# Patient Record
Sex: Female | Born: 1980
Health system: Southern US, Community
[De-identification: ages and names within clinical notes are randomized; demographics above are authoritative.]

## PROBLEM LIST (undated history)

## (undated) DIAGNOSIS — R7309 Other abnormal glucose: Principal | ICD-10-CM

## (undated) DIAGNOSIS — T7840XA Allergy, unspecified, initial encounter: Secondary | ICD-10-CM

## (undated) DIAGNOSIS — N946 Dysmenorrhea, unspecified: Secondary | ICD-10-CM

## (undated) DIAGNOSIS — R87629 Unspecified abnormal cytological findings in specimens from vagina: Secondary | ICD-10-CM

## (undated) DIAGNOSIS — E559 Vitamin D deficiency, unspecified: Secondary | ICD-10-CM

## (undated) DIAGNOSIS — N760 Acute vaginitis: Secondary | ICD-10-CM

## (undated) DIAGNOSIS — R87810 Cervical high risk human papillomavirus (HPV) DNA test positive: Principal | ICD-10-CM

## (undated) DIAGNOSIS — B009 Herpesviral infection, unspecified: Secondary | ICD-10-CM

## (undated) DIAGNOSIS — F410 Panic disorder [episodic paroxysmal anxiety] without agoraphobia: Secondary | ICD-10-CM

## (undated) DIAGNOSIS — N926 Irregular menstruation, unspecified: Principal | ICD-10-CM

## (undated) DIAGNOSIS — N898 Other specified noninflammatory disorders of vagina: Secondary | ICD-10-CM

## (undated) DIAGNOSIS — B9689 Other specified bacterial agents as the cause of diseases classified elsewhere: Secondary | ICD-10-CM

## (undated) HISTORY — DX: Irregular menstruation, unspecified: N92.6

## (undated) HISTORY — DX: Cervical high risk human papillomavirus (HPV) DNA test positive: R87.810

## (undated) HISTORY — DX: Herpesviral infection, unspecified: B00.9

## (undated) HISTORY — DX: Other specified noninflammatory disorders of vagina: N89.8

## (undated) HISTORY — DX: Other abnormal glucose: R73.09

## (undated) HISTORY — PX: CYST REMOVAL HAND: SHX6279

## (undated) HISTORY — PX: CHOLECYSTECTOMY: SHX55

## (undated) HISTORY — PX: TUBAL LIGATION: SHX77

## (undated) HISTORY — DX: Allergy, unspecified, initial encounter: T78.40XA

## (undated) HISTORY — DX: Dysmenorrhea, unspecified: N94.6

## (undated) HISTORY — DX: Other specified bacterial agents as the cause of diseases classified elsewhere: B96.89

## (undated) HISTORY — DX: Unspecified abnormal cytological findings in specimens from vagina: R87.629

## (undated) HISTORY — DX: Acute vaginitis: N76.0

## (undated) HISTORY — PX: CERVIX LESION DESTRUCTION: SHX591

## (undated) HISTORY — DX: Vitamin D deficiency, unspecified: E55.9

---

## 2003-07-14 ENCOUNTER — Emergency Department (HOSPITAL_COMMUNITY): Admission: EM | Admit: 2003-07-14 | Discharge: 2003-07-14 | Payer: Self-pay | Admitting: Emergency Medicine

## 2011-05-30 ENCOUNTER — Encounter: Payer: Self-pay | Admitting: *Deleted

## 2011-05-30 DIAGNOSIS — IMO0002 Reserved for concepts with insufficient information to code with codable children: Secondary | ICD-10-CM | POA: Insufficient documentation

## 2011-05-30 DIAGNOSIS — B9689 Other specified bacterial agents as the cause of diseases classified elsewhere: Secondary | ICD-10-CM | POA: Insufficient documentation

## 2011-05-30 DIAGNOSIS — A499 Bacterial infection, unspecified: Secondary | ICD-10-CM | POA: Insufficient documentation

## 2011-05-30 DIAGNOSIS — N760 Acute vaginitis: Secondary | ICD-10-CM | POA: Insufficient documentation

## 2011-05-30 DIAGNOSIS — T192XXA Foreign body in vulva and vagina, initial encounter: Secondary | ICD-10-CM | POA: Insufficient documentation

## 2011-05-30 NOTE — ED Notes (Signed)
Pt reports that a condom was lost during sexual activity.

## 2011-05-31 ENCOUNTER — Encounter (HOSPITAL_COMMUNITY): Payer: Self-pay | Admitting: Emergency Medicine

## 2011-05-31 ENCOUNTER — Emergency Department (HOSPITAL_COMMUNITY)
Admission: EM | Admit: 2011-05-31 | Discharge: 2011-05-31 | Discharge status: Against Medical Advice | Payer: Self-pay | Attending: Emergency Medicine | Admitting: Emergency Medicine

## 2011-05-31 DIAGNOSIS — T192XXA Foreign body in vulva and vagina, initial encounter: Secondary | ICD-10-CM

## 2011-05-31 DIAGNOSIS — B9689 Other specified bacterial agents as the cause of diseases classified elsewhere: Secondary | ICD-10-CM

## 2011-05-31 LAB — WET PREP, GENITAL: Yeast Wet Prep HPF POC: NONE SEEN

## 2011-05-31 NOTE — ED Provider Notes (Signed)
History     CSN: [id number] Arrival date & time: 05/31/2011 12:47 AM   First MD Initiated Contact with Patient 05/31/11 0122      Chief Complaint  Patient presents with  . Foreign Body in Vagina    (Consider location/radiation/quality/duration/timing/severity/associated sxs/prior treatment) HPI  Patient relates about 7 PM she was having sex with her boyfriend and and afterwards he was looking for something. Chest and was looking for and he said he lost a tickler. She states she's tried to get it out and has not been able to find it. She states she feels like she has the urge to urinate. She states they all tissues condoms. She denies any pain.  PCP Dr. Loney Hering in [place]  History reviewed. No pertinent past medical history.  Past Surgical History  Procedure Date  . Tubal ligation   . Cholecystectomy     History reviewed. No pertinent family history.  History  Substance Use Topics  . Smoking status: Never Smoker   . Smokeless tobacco: Not on file  . Alcohol Use: No   employed  OB History    Grav Para Term Preterm Abortions TAB SAB Ect Mult Living                  Review of Systems  All other systems reviewed and are negative.    Allergies  Compazine  Home Medications  No current outpatient prescriptions on file.  BP 122/63  Pulse 87  Temp(Src) 97.8 F (36.6 C) (Oral)  Resp 20  Ht 5\' 5"  (1.651 m)  Wt 182 lb (82.555 kg)  BMI 30.29 kg/m2  SpO2 100%  LMP 05/23/2011  Vital signs normal  Physical Exam  Nursing note and vitals reviewed. Constitutional: She is oriented to person, place, and time. She appears well-developed and well-nourished.  Non-toxic appearance. She does not appear ill. No distress.  HENT:  Head: Normocephalic and atraumatic.  Right Ear: External ear normal.  Left Ear: External ear normal.  Nose: Nose normal. No mucosal edema or rhinorrhea.  Mouth/Throat: Oropharynx is clear and moist and mucous membranes are normal. No dental  abscesses or uvula swelling.  Eyes: Conjunctivae and EOM are normal. Pupils are equal, round, and reactive to light.  Neck: Normal range of motion and full passive range of motion without pain. Neck supple.  Pulmonary/Chest: Effort normal and breath sounds normal. No respiratory distress. She has no rhonchi. She exhibits no crepitus.  Abdominal: Normal appearance.  Genitourinary:       patient has normal external genitalia, a green rubber [place] tickler was found in her vaginal vault and removed intact. Patient asked if she wants to be checked for STDs and she said yes so samples were obtained.  Musculoskeletal: Normal range of motion. She exhibits no edema and no tenderness.       Moves all extremities well.   Neurological: She is alert and oriented to person, place, and time. She has normal strength. No cranial nerve deficit.  Skin: Skin is warm, dry and intact. No rash noted. No erythema. No pallor.  Psychiatric: She has a normal mood and affect. Her speech is normal and behavior is normal. Her mood appears not anxious.    ED Course  Procedures (including critical care time)  Vaginal foreign body was removed intact without difficulty with ring forceps   Labs Reviewed  WET PREP, GENITAL - Abnormal; Notable for the following:    Clue Cells, Wet Prep FEW (*)    WBC, Wet Prep  HPF POC FEW (*)    All other components within normal limits  GC/CHLAMYDIA PROBE AMP, GENITAL     1. Vaginal foreign body   2. Bacterial vaginosis    Plan patient left without getting results of her prep or discharge instructions. She told nursing staff she was going out to the waiting room to talk to her aunt and never returned  Devoria Albe, MD, FACEP    MDM          Ward Givens, MD 05/31/11 [phone]

## 2011-05-31 NOTE — ED Notes (Signed)
At approximately 0200, patient hurriedly walked out of ED stating "I will be right back, I have to go check on my aunt." Patient never returned. Dr Lynelle Doctor aware.

## 2011-06-01 LAB — GC/CHLAMYDIA PROBE AMP, GENITAL: Chlamydia, DNA Probe: NEGATIVE

## 2011-08-15 ENCOUNTER — Other Ambulatory Visit (HOSPITAL_COMMUNITY)
Admission: RE | Admit: 2011-08-15 | Discharge: 2011-08-15 | Disposition: A | Discharge status: Home / Self Care | Payer: Self-pay | Source: Ambulatory Visit | Attending: Obstetrics and Gynecology | Admitting: Obstetrics and Gynecology

## 2011-08-15 DIAGNOSIS — Z113 Encounter for screening for infections with a predominantly sexual mode of transmission: Secondary | ICD-10-CM | POA: Insufficient documentation

## 2011-08-15 DIAGNOSIS — Z01419 Encounter for gynecological examination (general) (routine) without abnormal findings: Secondary | ICD-10-CM | POA: Insufficient documentation

## 2011-08-15 DIAGNOSIS — Z1159 Encounter for screening for other viral diseases: Secondary | ICD-10-CM | POA: Insufficient documentation

## 2012-12-30 ENCOUNTER — Emergency Department (HOSPITAL_COMMUNITY): Payer: BC Managed Care – PPO

## 2012-12-30 ENCOUNTER — Emergency Department (HOSPITAL_COMMUNITY)
Admission: EM | Admit: 2012-12-30 | Discharge: 2012-12-30 | Disposition: A | Discharge status: Home / Self Care | Payer: BC Managed Care – PPO | Attending: Emergency Medicine | Admitting: Emergency Medicine

## 2012-12-30 ENCOUNTER — Encounter (HOSPITAL_COMMUNITY): Payer: Self-pay | Admitting: Emergency Medicine

## 2012-12-30 DIAGNOSIS — F411 Generalized anxiety disorder: Secondary | ICD-10-CM | POA: Insufficient documentation

## 2012-12-30 DIAGNOSIS — R42 Dizziness and giddiness: Secondary | ICD-10-CM | POA: Insufficient documentation

## 2012-12-30 DIAGNOSIS — F419 Anxiety disorder, unspecified: Secondary | ICD-10-CM

## 2012-12-30 DIAGNOSIS — Z8659 Personal history of other mental and behavioral disorders: Secondary | ICD-10-CM | POA: Insufficient documentation

## 2012-12-30 DIAGNOSIS — R0789 Other chest pain: Secondary | ICD-10-CM | POA: Insufficient documentation

## 2012-12-30 DIAGNOSIS — R450 Nervousness: Secondary | ICD-10-CM | POA: Insufficient documentation

## 2012-12-30 DIAGNOSIS — R0602 Shortness of breath: Secondary | ICD-10-CM | POA: Insufficient documentation

## 2012-12-30 HISTORY — DX: Panic disorder (episodic paroxysmal anxiety): F41.0

## 2012-12-30 MED ORDER — ALPRAZOLAM 0.5 MG PO TABS: ORAL_TABLET | ORAL | Status: DC

## 2012-12-30 NOTE — ED Provider Notes (Signed)
History    This chart was scribed for Carol Lennert, MD by Carol Wong, ED Scribe. This patient was seen in room APA12/APA12 and the patient's care was started 2:29 PM.  CSN: [id number] Arrival date & time 12/30/12  1258  First MD Initiated Contact with Patient 12/30/12 1426     Chief Complaint  Patient presents with  . Dizziness  . Anxiety    Patient is a 32 y.o. female presenting with anxiety. The history is provided by the patient. No language interpreter was used.  Anxiety This is a recurrent problem. The current episode started yesterday. The problem occurs rarely. The problem has not changed since onset.Pertinent negatives include no chest pain, no abdominal pain and no headaches. Nothing aggravates the symptoms. Nothing relieves the symptoms.    HPI Comments: Carol Wong is a 32 y.o. female who presents to the Emergency Department complaining of anxiety and dizziness that started last night. Pt believes she had an anxiety attack last night with symptoms that included chest pressure and SOB. Per pt, she "felt like I was going to die." States the symptoms come and go and she had associated dizziness as well. She does not take any medications for this and does not have a PCP.   Past Medical History  Diagnosis Date  . Panic attacks    Past Surgical History  Procedure Laterality Date  . Tubal ligation    . Cholecystectomy     History reviewed. No pertinent family history. History  Substance Use Topics  . Smoking status: Never Smoker   . Smokeless tobacco: Not on file  . Alcohol Use: No   OB History   Grav Para Term Preterm Abortions TAB SAB Ect Mult Living                 Review of Systems  Constitutional: Negative for appetite change and fatigue.  HENT: Negative for congestion, sinus pressure and ear discharge.   Eyes: Negative for discharge.  Respiratory: Negative for cough.   Cardiovascular: Negative for chest pain.  Gastrointestinal: Negative for  abdominal pain and diarrhea.  Genitourinary: Negative for frequency and hematuria.  Musculoskeletal: Negative for back pain.  Skin: Negative for rash.  Neurological: Positive for dizziness. Negative for seizures and headaches.  Psychiatric/Behavioral: Negative for hallucinations. The patient is nervous/anxious.     Allergies  Compazine  Home Medications   Current Outpatient Rx  Name  Route  Sig  Dispense  Refill  . ibuprofen (ADVIL,MOTRIN) 200 MG tablet   Oral   Take 200 mg by mouth every 8 (eight) hours as needed for pain or headache.          BP 121/71  Pulse 96  Temp(Src) 98.3 F (36.8 C) (Oral)  Resp 16  SpO2 100%  LMP 12/10/2012 Physical Exam  Nursing note and vitals reviewed. Constitutional: She is oriented to person, place, and time. She appears well-developed.  HENT:  Head: Normocephalic.  Eyes: Conjunctivae and EOM are normal. No scleral icterus.  Neck: Neck supple. No thyromegaly present.  Cardiovascular: Normal rate and regular rhythm.  Exam reveals no gallop and no friction rub.   No murmur heard. Pulmonary/Chest: No stridor. She has no wheezes. She has no rales. She exhibits no tenderness.  Abdominal: She exhibits no distension. There is no tenderness. There is no rebound.  Musculoskeletal: Normal range of motion. She exhibits no edema.  Lymphadenopathy:    She has no cervical adenopathy.  Neurological: She is oriented to person,  place, and time. Coordination normal.  Skin: No rash noted. No erythema.  Psychiatric: She has a normal mood and affect. Her behavior is normal.    ED Course  Procedures (including critical care time)  DIAGNOSTIC STUDIES: Oxygen Saturation is 100% on RA, normal by my interpretation.    COORDINATION OF CARE: 2:33 PM Discussed treatment plan with pt at bedside and pt agreed to plan.   Labs Reviewed - No data to display No results found. No diagnosis found.  Date: 12/30/2012  Rate:74  Rhythm: normal sinus rhythm  QRS  Axis: normal  Intervals: normal  ST/T Wave abnormalities: normal  Conduction Disutrbances:none  Narrative Interpretation:   Old EKG Reviewed: none available   MDM    The chart was scribed for me under my direct supervision.  I personally performed the history, physical, and medical decision making and all procedures in the evaluation of this patient.Carol Lennert, MD 12/30/12 1524

## 2012-12-30 NOTE — ED Notes (Signed)
Denies si/hi/hallucinations/hearing voices.

## 2012-12-30 NOTE — ED Notes (Signed)
Pt presents with c/o " not feeling well x 2-3 days, worsening anxiety and dizzness".  Pt denies headache, states "temple areas are achy but denies pain. Denies blurred vision. Pt reports just " not feeling right". Pt is alert and oriented x 4, skin pink warm and dry to touch. NAD noted at this time.

## 2012-12-30 NOTE — ED Notes (Signed)
Dizziness intermittent. Chest pressure. "I feel like Im going to die". Pt states she can breathe but something is keeping her from breathing like shen normally should. Denies n/v/d. Hx of panic attacks. Nondiaphoretic. nad at thist ime.

## 2013-05-27 ENCOUNTER — Ambulatory Visit: Payer: Self-pay | Admitting: Family Medicine

## 2013-06-11 ENCOUNTER — Encounter: Payer: Self-pay | Admitting: Family Medicine

## 2013-06-11 ENCOUNTER — Ambulatory Visit: Payer: Self-pay | Admitting: Family Medicine

## 2013-06-11 ENCOUNTER — Other Ambulatory Visit: Payer: Self-pay | Admitting: Adult Health

## 2013-06-24 ENCOUNTER — Encounter: Payer: Self-pay | Admitting: Adult Health

## 2013-06-24 ENCOUNTER — Ambulatory Visit (INDEPENDENT_AMBULATORY_CARE_PROVIDER_SITE_OTHER): Payer: BC Managed Care – PPO | Admitting: Adult Health

## 2013-06-24 VITALS — BP 118/70 | HR 78 | Ht 65.0 in | Wt 175.0 lb

## 2013-06-24 DIAGNOSIS — B9689 Other specified bacterial agents as the cause of diseases classified elsewhere: Secondary | ICD-10-CM

## 2013-06-24 DIAGNOSIS — A499 Bacterial infection, unspecified: Secondary | ICD-10-CM

## 2013-06-24 DIAGNOSIS — N898 Other specified noninflammatory disorders of vagina: Secondary | ICD-10-CM

## 2013-06-24 DIAGNOSIS — Z01419 Encounter for gynecological examination (general) (routine) without abnormal findings: Secondary | ICD-10-CM

## 2013-06-24 DIAGNOSIS — F419 Anxiety disorder, unspecified: Secondary | ICD-10-CM | POA: Insufficient documentation

## 2013-06-24 DIAGNOSIS — N760 Acute vaginitis: Secondary | ICD-10-CM

## 2013-06-24 HISTORY — DX: Acute vaginitis: B96.89

## 2013-06-24 HISTORY — DX: Other specified noninflammatory disorders of vagina: N89.8

## 2013-06-24 LAB — CBC
HEMATOCRIT: 38 % (ref 36.0–46.0)
HEMOGLOBIN: 12.1 g/dL (ref 12.0–15.0)
MCH: 25.2 pg — ABNORMAL LOW (ref 26.0–34.0)
MCHC: 31.8 g/dL (ref 30.0–36.0)
MCV: 79 fL (ref 78.0–100.0)
Platelets: 230 10*3/uL (ref 150–400)
RBC: 4.81 MIL/uL (ref 3.87–5.11)
RDW: 14.7 % (ref 11.5–15.5)
WBC: 9.1 10*3/uL (ref 4.0–10.5)

## 2013-06-24 LAB — POCT WET PREP (WET MOUNT)
TRICHOMONAS WET PREP HPF POC: NEGATIVE
WBC WET PREP: POSITIVE

## 2013-06-24 MED ORDER — METRONIDAZOLE 500 MG PO TABS: 500.0000 mg | ORAL_TABLET | Freq: Two times a day (BID) | ORAL | Status: DC

## 2013-06-24 NOTE — Patient Instructions (Signed)
Generalized Anxiety Disorder Generalized anxiety disorder (GAD) is a mental disorder. It interferes with life functions, including relationships, work, and school. GAD is different from normal anxiety, which everyone experiences at some point in their lives in response to specific life events and activities. Normal anxiety actually helps [place] prepare for and get through these life events and activities. Normal anxiety goes away after the event or activity is over.  GAD causes anxiety that is not necessarily related to specific events or activities. It also causes excess anxiety in proportion to specific events or activities. The anxiety associated with GAD is also difficult to control. GAD can vary from mild to severe. People with severe GAD can have intense waves of anxiety with physical symptoms (panic attacks).  SYMPTOMS The anxiety and worry associated with GAD are difficult to control. This anxiety and worry are related to many life events and activities and also occur more days than not for 6 months or longer. People with GAD also have three or more of the following symptoms (one or more in children):  Restlessness.   Fatigue.  Difficulty concentrating.   Irritability.  Muscle tension.  Difficulty sleeping or unsatisfying sleep. DIAGNOSIS GAD is diagnosed through an assessment by your caregiver. Your caregiver will ask you questions aboutyour mood,physical symptoms, and events in your life. Your caregiver may ask you about your medical history and use of alcohol or drugs, including prescription medications. Your caregiver may also do a physical exam and blood tests. Certain medical conditions and the use of certain substances can cause symptoms similar to those associated with GAD. Your caregiver may refer you to a mental health specialist for further evaluation. TREATMENT The following therapies are usually used to treat GAD:   Medication Antidepressant medication usually is  prescribed for long-term daily control. Antianxiety medications may be added in severe cases, especially when panic attacks occur.   Talk therapy (psychotherapy) Certain types of talk therapy can be helpful in treating GAD by providing support, education, and guidance. A form of talk therapy called cognitive behavioral therapy can teach you healthy ways to think about and react to daily life events and activities.  Stress managementtechniques These include yoga, meditation, and exercise and can be very helpful when they are practiced regularly. A mental health specialist can help determine which treatment is best for you. Some people see improvement with one therapy. However, other people require a combination of therapies. Document Released: 09/24/2012 Document Reviewed: 09/24/2012 [facility] Patient Information 2014 [place], [place]. Bacterial Vaginosis Bacterial vaginosis is a vaginal infection that occurs when the normal balance of bacteria in the vagina is disrupted. It results from an overgrowth of certain bacteria. This is the most common vaginal infection in women of childbearing age. Treatment is important to prevent complications, especially in pregnant women, as it can cause a premature delivery. CAUSES  Bacterial vaginosis is caused by an increase in harmful bacteria that are normally present in smaller amounts in the vagina. Several different kinds of bacteria can cause bacterial vaginosis. However, the reason that the condition develops is not fully understood. RISK FACTORS Certain activities or behaviors can put you at an increased risk of developing bacterial vaginosis, including:  Having a new sex partner or multiple sex partners.  Douching.  Using an intrauterine device (IUD) for contraception. Women do not get bacterial vaginosis from toilet seats, bedding, swimming pools, or contact with objects around them. SIGNS AND SYMPTOMS  Some women with bacterial vaginosis have no  signs or symptoms. Common  symptoms include:  Grey vaginal discharge.  A fishlike odor with discharge, especially after sexual intercourse.  Itching or burning of the vagina and vulva.  Burning or pain with urination. DIAGNOSIS  Your health care provider will take a medical history and examine the vagina for signs of bacterial vaginosis. A sample of vaginal fluid may be taken. Your health care provider will look at this sample under a microscope to check for bacteria and abnormal cells. A vaginal pH test may also be done.  TREATMENT  Bacterial vaginosis may be treated with antibiotic medicines. These may be given in the form of a pill or a vaginal cream. A second round of antibiotics may be prescribed if the condition comes back after treatment.  HOME CARE INSTRUCTIONS   Only take over-the-counter or prescription medicines as directed by your health care provider.  If antibiotic medicine was prescribed, take it as directed. Make sure you finish it even if you start to feel better.  Do not have sex until treatment is completed.  Tell all sexual partners that you have a vaginal infection. They should see their health care provider and be treated if they have problems, such as a mild rash or itching.  Practice safe sex by using condoms and only having one sex partner. SEEK MEDICAL CARE IF:   Your symptoms are not improving after 3 days of treatment.  You have increased discharge or pain.  You have a fever. MAKE SURE YOU:   Understand these instructions.  Will watch your condition.  Will get help right away if you are not doing well or get worse. FOR MORE INFORMATION  Centers for Disease Control and Prevention, Division of STD Prevention: SolutionApps.co.zawww.cdc.gov/std American Sexual Health Association (ASHA): www.ashastd.org  Document Released: 05/30/2005 Document Revised: 03/20/2013 Document Reviewed: 01/09/2013 [facility] Patient Information 2014 [place]. Physical in 1 year Follow  labs Mammogram at 7640 See counselor  Today

## 2013-06-24 NOTE — Progress Notes (Signed)
Patient ID: Carol Wong, female   DOB: 11/21/1980, 33 y.o.   MRN: [id number] History of Present Illness: Carol Wong is a 33 year old black female, single in for a physical.She had a normal pap with negative HPV 08/15/11.She complains of being anxious, "not feeling like herself, like she is outside her body"  And that she is dying at times, has been rx'd xanax but did not take, does not want side effects or dependence. [facility] [place] talk.She complains of BTB and vaginal discharge with odor.Works at [employer] and lives with 2 kids.Seen at urgent care had elevated blood sugar and BP.Pt thinks she has lymph node right neck.Has elevated heart reate at times.  Current Medications, Allergies, Past Medical History, Past Surgical History, Family History and Social History were reviewed [facility].   Past Medical History  Diagnosis Date  . Panic attacks   . Vaginal discharge 06/24/2013  . BV (bacterial vaginosis) 06/24/2013   Past Surgical History  Procedure Laterality Date  . Tubal ligation    . Cholecystectomy    . Cyst removal hand Left   Current outpatient prescriptions:ibuprofen (ADVIL,MOTRIN) 200 MG tablet, Take 200 mg by mouth every 8 (eight) hours as needed for pain or headache., Disp: , Rfl: ;  metroNIDAZOLE (FLAGYL) 500 MG tablet, Take 1 tablet (500 mg total) by mouth 2 (two) times daily., Disp: 14 tablet, Rfl: 0  Review of Systems: Patient denies any headaches, blurred vision, shortness of breath, chest pain, abdominal pain, problems with bowel movements, urination, or intercourse. No joint pain, see HPI.    Physical Exam:BP 118/70  Pulse 78  Ht 5\' 5"  (1.651 m)  Wt 175 lb (79.379 kg)  BMI 29.12 kg/m2  LMP 06/16/2013 General:  Well developed, well nourished, no acute distress Skin:  Warm and dry Neck:  Midline trachea, normal thyroid, no lymph node felt ?ligament, non tender Lungs; Clear to auscultation bilaterally Breast:  No  dominant palpable mass, retraction, or nipple discharge Cardiovascular: Regular rate and rhythm Abdomen:  Soft, non tender, no hepatosplenomegaly Pelvic:  External genitalia is normal in appearance.  The vagina is normal in appearance, except for tannish discharge. The cervix is bulbous.  Uterus is felt to be normal size, shape, and contour.  No                adnexal masses or tenderness noted.wet prep was +WBC and clue cells, GC/CGHL sent. Extremities:  No swelling or varicosities noted Psych:  No mood changes, alert and cooperative but anxious Told her how to check pulse and that she sounds like she many have GAD.  Impression: Yearly gyn exam no pap Anxiety Vaginal discharge BV    Plan: Rx flagyl 500 mg 1 bid x 7 days, no alcohol, review handout on BV   GC/CHL sent Check CBC,CMP and TSH [facility] counseling good,since does not want meds Review hand out on GAD(general anxiety disorder) and that the counselor may want her to try meds Physical in 1 year

## 2013-06-25 ENCOUNTER — Telehealth: Payer: Self-pay | Admitting: Adult Health

## 2013-06-25 LAB — COMPREHENSIVE METABOLIC PANEL
ALBUMIN: 4.1 g/dL (ref 3.5–5.2)
ALT: 15 U/L (ref 0–35)
AST: 21 U/L (ref 0–37)
Alkaline Phosphatase: 56 U/L (ref 39–117)
BUN: 10 mg/dL (ref 6–23)
CALCIUM: 9 mg/dL (ref 8.4–10.5)
CHLORIDE: 104 meq/L (ref 96–112)
CO2: 24 meq/L (ref 19–32)
Creat: 0.82 mg/dL (ref 0.50–1.10)
Glucose, Bld: 96 mg/dL (ref 70–99)
POTASSIUM: 3.7 meq/L (ref 3.5–5.3)
Sodium: 137 mEq/L (ref 135–145)
Total Bilirubin: 0.9 mg/dL (ref 0.3–1.2)
Total Protein: 7 g/dL (ref 6.0–8.3)

## 2013-06-25 LAB — GC/CHLAMYDIA PROBE AMP
CT PROBE, AMP APTIMA: NEGATIVE
GC Probe RNA: NEGATIVE

## 2013-06-25 LAB — TSH: TSH: 1.576 u[IU]/mL (ref 0.350–4.500)

## 2013-06-25 NOTE — Telephone Encounter (Signed)
Pt aware of labs  

## 2013-12-25 ENCOUNTER — Encounter: Payer: Self-pay | Admitting: *Deleted

## 2014-01-20 ENCOUNTER — Emergency Department (HOSPITAL_COMMUNITY)
Admission: EM | Admit: 2014-01-20 | Discharge: 2014-01-20 | Disposition: A | Discharge status: Home / Self Care | Payer: 59 | Attending: Emergency Medicine | Admitting: Emergency Medicine

## 2014-01-20 ENCOUNTER — Encounter (HOSPITAL_COMMUNITY): Payer: Self-pay | Admitting: Emergency Medicine

## 2014-01-20 ENCOUNTER — Emergency Department (HOSPITAL_COMMUNITY): Payer: 59

## 2014-01-20 DIAGNOSIS — Z8619 Personal history of other infectious and parasitic diseases: Secondary | ICD-10-CM | POA: Diagnosis not present

## 2014-01-20 DIAGNOSIS — F410 Panic disorder [episodic paroxysmal anxiety] without agoraphobia: Secondary | ICD-10-CM | POA: Diagnosis not present

## 2014-01-20 DIAGNOSIS — R079 Chest pain, unspecified: Secondary | ICD-10-CM | POA: Insufficient documentation

## 2014-01-20 DIAGNOSIS — Z8742 Personal history of other diseases of the female genital tract: Secondary | ICD-10-CM | POA: Diagnosis not present

## 2014-01-20 MED ORDER — LORAZEPAM 1 MG PO TABS: 1.0000 mg | ORAL_TABLET | Freq: Three times a day (TID) | ORAL | Status: DC | PRN

## 2014-01-20 NOTE — ED Provider Notes (Signed)
CSN: [id number]     Arrival date & time 01/20/14  1602 History   First MD Initiated Contact with Patient 01/20/14 1624     Chief Complaint  Patient presents with  . Chest Pain     (Consider location/radiation/quality/duration/timing/severity/associated sxs/prior Treatment) HPI.... lower central chest pain for 2 days, intermittent, squeezing in sensation. No frank dyspnea, diaphoresis, nausea. Nonsmoker. No chronic health problems. Patient has seasonal allergies. Grandmother passed away  08/22/2024which is making her very anxious. No history of early cardiac death.  Past Medical History  Diagnosis Date  . Panic attacks   . Vaginal discharge 06/24/2013  . BV (bacterial vaginosis) 06/24/2013  . Allergy    Past Surgical History  Procedure Laterality Date  . Tubal ligation    . Cholecystectomy    . Cyst removal hand Left    Family History  Problem Relation Age of Onset  . Hypertension Mother   . Diabetes Mother   . Hypertension Father   . Diabetes Father   . Sickle cell trait Daughter   . Anemia Daughter   . Thyroid disease Maternal Aunt   . Asthma Maternal Uncle   . Cancer Maternal Grandmother     lung   . Kidney disease Maternal Grandfather   . Diabetes Maternal Grandfather    History  Substance Use Topics  . Smoking status: Never Smoker   . Smokeless tobacco: Never Used  . Alcohol Use: No   OB History   Grav Para Term Preterm Abortions TAB SAB Ect Mult Living   3 2   1  1   2      Review of Systems  All other systems reviewed and are negative.     Allergies  Compazine  Home Medications   Prior to Admission medications   Medication Sig Start Date End Date Taking? Authorizing Provider  LORazepam (ATIVAN) 1 MG tablet Take 1 tablet (1 mg total) by mouth 3 (three) times daily as needed for anxiety. 01/20/14   Donnetta Hutching, MD   BP 121/87  Pulse 86  Temp(Src) 98 F (36.7 C) (Oral)  Resp 18  Ht 5\' 5"  (1.651 m)  Wt 170 lb (77.111 kg)  BMI 28.29 kg/m2  SpO2 100%   LMP 01/12/2014 Physical Exam  Nursing note and vitals reviewed. Constitutional: She is oriented to person, place, and time. She appears well-developed and well-nourished.  HENT:  Head: Normocephalic and atraumatic.  Eyes: Conjunctivae and EOM are normal. Pupils are equal, round, and reactive to light.  Neck: Normal range of motion. Neck supple.  Cardiovascular: Normal rate, regular rhythm and normal heart sounds.   Pulmonary/Chest: Effort normal and breath sounds normal.  Abdominal: Soft. Bowel sounds are normal.  Musculoskeletal: Normal range of motion.  Neurological: She is alert and oriented to person, place, and time.  Skin: Skin is warm and dry.  Psychiatric: She has a normal mood and affect. Her behavior is normal.    ED Course  Procedures (including critical care time) Labs Review Labs Reviewed - No data to display  Imaging Review Dg Chest 2 View  01/20/2014   CLINICAL DATA:  Chest pressure and shortness of breath today  EXAM: CHEST  2 VIEW  COMPARISON:  12/30/2012  FINDINGS: Normal heart size, mediastinal contours, and pulmonary vascularity.  Lungs clear.  No pleural effusion or pneumothorax.  Biconvex thoracic scoliosis.  IMPRESSION: No acute abnormalities.   Electronically Signed   By: Ulyses Southward M.D.   On: 01/20/2014 17:30  EKG Interpretation   Date/Time:  Monday January 20 2014 16:08:17 EDT Ventricular Rate:  84 PR Interval:  104 QRS Duration: 78 QT Interval:  348 QTC Calculation: 411 R Axis:   51 Text Interpretation:  Sinus rhythm with short PR Nonspecific T wave  abnormality Abnormal ECG Confirmed by Calbert Hulsebus  MD, Kisha Messman ([id number]) on 01/20/2014  4:34:39 PM      MDM   Final diagnoses:  Chest pain, unspecified chest pain type    Patient is low risk for acute coronary syndrome or pulmonary embolism. Chest x-ray and EKG are normal. Discharge medications Ativan 1 mg    Donnetta HutchingBrian Alean Kromer, MD 01/20/14 1749

## 2014-01-20 NOTE — ED Notes (Signed)
Pt c/o "squeezing " sensation around heart and difficulty breathing for past 2 days.

## 2014-01-20 NOTE — ED Notes (Signed)
Pt states intermittent, center pain to chest at times. Pt also states she sometimes has to make herself take a deep breath. Pt states she has been under a lot of stress lately

## 2014-01-20 NOTE — Discharge Instructions (Signed)
Chest Pain (Nonspecific) It is often hard to give a diagnosis for the cause of chest pain. There is always a chance that your pain could be related to something serious, such as a heart attack or a blood clot in the lungs. You need to follow up with your doctor. HOME CARE  If antibiotic medicine was given, take it as directed by your doctor. Finish the medicine even if you start to feel better.  For the next few days, avoid activities that bring on chest pain. Continue physical activities as told by your doctor.  Do not use any tobacco products. This includes cigarettes, chewing tobacco, and e-cigarettes.  Avoid drinking alcohol.  Only take medicine as told by your doctor.  Follow your doctor's suggestions for more testing if your chest pain does not go away.  Keep all doctor visits you made. GET HELP IF:  Your chest pain does not go away, even after treatment.  You have a rash with blisters on your chest.  You have a fever. GET HELP RIGHT AWAY IF:   You have more pain or pain that spreads to your arm, neck, jaw, back, or belly (abdomen).  You have shortness of breath.  You cough more than usual or cough up blood.  You have very bad back or belly pain.  You feel sick to your stomach (nauseous) or throw up (vomit).  You have very bad weakness.  You pass out (faint).  You have chills. This is an emergency. Do not wait to see if the problems will go away. Call your local emergency services (911 in [place] Do not drive yourself to the hospital. MAKE SURE YOU:   Understand these instructions.  Will watch your condition.  Will get help right away if you are not doing well or get worse. Document Released: 11/16/2007 Document Revised: 06/04/2013 Document Reviewed: 11/16/2007 [facility] Patient Information 2015 [place], [place]. This information is not intended to replace advice given to you by your health care provider. Make sure you discuss any questions you have with your  health care provider.   EKG and chest x-ray were normal. Medication for anxiety. Try to get a primary care physician.

## 2014-02-04 ENCOUNTER — Ambulatory Visit: Payer: 59 | Admitting: Adult Health

## 2014-02-05 ENCOUNTER — Ambulatory Visit: Payer: 59 | Admitting: Adult Health

## 2014-02-12 ENCOUNTER — Encounter: Payer: Self-pay | Admitting: Adult Health

## 2014-02-12 ENCOUNTER — Ambulatory Visit (INDEPENDENT_AMBULATORY_CARE_PROVIDER_SITE_OTHER): Payer: 59 | Admitting: Adult Health

## 2014-02-12 VITALS — BP 122/80 | Temp 98.6°F | Ht 65.0 in | Wt 173.0 lb

## 2014-02-12 DIAGNOSIS — N926 Irregular menstruation, unspecified: Secondary | ICD-10-CM | POA: Insufficient documentation

## 2014-02-12 HISTORY — DX: Irregular menstruation, unspecified: N92.6

## 2014-02-12 NOTE — Patient Instructions (Signed)
return in 1 week for pap and physical See dentist

## 2014-02-12 NOTE — Progress Notes (Signed)
Subjective:     Patient ID: Carol Wong, female   DOB: 17-Jul-1980, 33 y.o.   MRN: [id number]  HPI Carol Wong is a 33 year old black female in complaining of irregular cycles,may be late or even heavy at times,she is sp BTL.Complains of congestion and having to clear throat and tongue looks white to her.  Review of Systems See HPI Reviewed past medical,surgical, social and family history. Reviewed medications and allergies.     Objective:   Physical Exam BP 122/80  Temp(Src) 98.6 F (37 C)  Ht  (1.651 m)  Wt 173 lb (78.472 kg)  BMI 28.79 kg/m2  LMP 02/03/2014   Skin warm and dry.Pelvic: external genitalia is normal in appearance, vagina: white discharge without odor, cervix:smooth and bulbous, uterus: normal size, shape and contour, non tender, no masses felt, adnexa: no masses or tenderness noted.  GC/CHL obtained.   Neck: mid line trachea, normal thyroid. Lungs: clear to ausculation bilaterally. Cardiovascular: regular rate and rhythm.tongue looks normal, with good texture, no lesions.   Assessment:     Irregular periods    Plan:    See Dentist as ascheduled Check CBC,CMP,TSH and GC/CHL Return in 1 week for pap and physical   May try OCs after labs back, will discuss next week

## 2014-02-13 LAB — CBC
HEMATOCRIT: 37.3 % (ref 36.0–46.0)
Hemoglobin: 11.9 g/dL — ABNORMAL LOW (ref 12.0–15.0)
MCH: 25.1 pg — ABNORMAL LOW (ref 26.0–34.0)
MCHC: 31.9 g/dL (ref 30.0–36.0)
MCV: 78.7 fL (ref 78.0–100.0)
Platelets: 238 10*3/uL (ref 150–400)
RBC: 4.74 MIL/uL (ref 3.87–5.11)
RDW: 14.2 % (ref 11.5–15.5)
WBC: 8.1 10*3/uL (ref 4.0–10.5)

## 2014-02-13 LAB — COMPREHENSIVE METABOLIC PANEL
ALK PHOS: 44 U/L (ref 39–117)
ALT: 14 U/L (ref 0–35)
AST: 18 U/L (ref 0–37)
Albumin: 4.1 g/dL (ref 3.5–5.2)
BILIRUBIN TOTAL: 0.6 mg/dL (ref 0.2–1.2)
BUN: 10 mg/dL (ref 6–23)
CO2: 26 mEq/L (ref 19–32)
CREATININE: 0.82 mg/dL (ref 0.50–1.10)
Calcium: 9.2 mg/dL (ref 8.4–10.5)
Chloride: 103 mEq/L (ref 96–112)
Glucose, Bld: 90 mg/dL (ref 70–99)
Potassium: 3.8 mEq/L (ref 3.5–5.3)
Sodium: 138 mEq/L (ref 135–145)
Total Protein: 6.8 g/dL (ref 6.0–8.3)

## 2014-02-13 LAB — GC/CHLAMYDIA PROBE AMP
CT Probe RNA: NEGATIVE
GC Probe RNA: NEGATIVE

## 2014-02-13 LAB — TSH: TSH: 1.325 u[IU]/mL (ref 0.350–4.500)

## 2014-02-18 ENCOUNTER — Telehealth: Payer: Self-pay | Admitting: Adult Health

## 2014-02-18 NOTE — Telephone Encounter (Signed)
No voice mail,if calls back labs normal

## 2014-02-19 ENCOUNTER — Ambulatory Visit: Payer: 59 | Admitting: Adult Health

## 2014-02-20 ENCOUNTER — Telehealth: Payer: Self-pay | Admitting: Family Medicine

## 2014-02-24 ENCOUNTER — Emergency Department (HOSPITAL_COMMUNITY)
Admission: EM | Admit: 2014-02-24 | Discharge: 2014-02-24 | Disposition: A | Discharge status: Home / Self Care | Payer: 59 | Attending: Emergency Medicine | Admitting: Emergency Medicine

## 2014-02-24 ENCOUNTER — Encounter (HOSPITAL_COMMUNITY): Payer: Self-pay | Admitting: Emergency Medicine

## 2014-02-24 DIAGNOSIS — N926 Irregular menstruation, unspecified: Secondary | ICD-10-CM | POA: Insufficient documentation

## 2014-02-24 DIAGNOSIS — Z8619 Personal history of other infectious and parasitic diseases: Secondary | ICD-10-CM | POA: Diagnosis not present

## 2014-02-24 DIAGNOSIS — N939 Abnormal uterine and vaginal bleeding, unspecified: Secondary | ICD-10-CM | POA: Diagnosis not present

## 2014-02-24 DIAGNOSIS — R0602 Shortness of breath: Secondary | ICD-10-CM | POA: Insufficient documentation

## 2014-02-24 DIAGNOSIS — I279 Pulmonary heart disease, unspecified: Secondary | ICD-10-CM | POA: Insufficient documentation

## 2014-02-24 DIAGNOSIS — J019 Acute sinusitis, unspecified: Secondary | ICD-10-CM

## 2014-02-24 DIAGNOSIS — J069 Acute upper respiratory infection, unspecified: Secondary | ICD-10-CM | POA: Diagnosis not present

## 2014-02-24 DIAGNOSIS — F411 Generalized anxiety disorder: Secondary | ICD-10-CM | POA: Diagnosis not present

## 2014-02-24 MED ORDER — PREDNISONE 10 MG PO TABS: 20.0000 mg | ORAL_TABLET | Freq: Every day | ORAL | Status: DC

## 2014-02-24 MED ORDER — PREDNISONE 50 MG PO TABS
60.0000 mg | ORAL_TABLET | Freq: Once | ORAL | Status: AC
  Administered 2014-02-24: 60 mg via ORAL
  Filled 2014-02-24 (×2): qty 1

## 2014-02-24 MED ORDER — OXYMETAZOLINE HCL 0.05 % NA SOLN
2.0000 | Freq: Once | NASAL | Status: AC
  Administered 2014-02-24: 2 via NASAL
  Filled 2014-02-24: qty 15

## 2014-02-24 NOTE — ED Provider Notes (Signed)
CSN: [id number]     Arrival date & time 02/24/14  1156 History   First MD Initiated Contact with Patient 02/24/14 1436   This chart was scribed for non-physician practitioner Ivery Quale, PA-C,  working with Hilario Quarry, MD by Gwenevere Abbot, ED scribe. This patient was seen in room APFT22/APFT22 and the patient's care was started at 2:40 PM.    Chief Complaint  Patient presents with  . Shortness of Breath   Patient is a 33 y.o. female presenting with shortness of breath. The history is provided by the patient. No language interpreter was used.  Shortness of Breath Severity:  Moderate Onset quality:  Gradual Chronicity:  New Associated symptoms: cough   Associated symptoms: no abdominal pain, no chest pain, no fever, no neck pain, no rash and no wheezing   Risk factors: no recent alcohol use, no family hx of DVT, no oral contraceptive use, no prolonged immobilization and no tobacco use    HPI Comments:  Carol Wong is a 33 y.o. female who presents to the Emergency Department complaining of SOB, with associated symptoms of congestion, and sinus drainage. Pt denies fever, chills, cough, vomiting, and diarrhea.  Past Medical History  Diagnosis Date  . Panic attacks   . Vaginal discharge 06/24/2013  . BV (bacterial vaginosis) 06/24/2013  . Allergy   . Irregular periods 02/12/2014   Past Surgical History  Procedure Laterality Date  . Tubal ligation    . Cholecystectomy    . Cyst removal hand Left    Family History  Problem Relation Age of Onset  . Hypertension Mother   . Diabetes Mother   . Hypertension Father   . Diabetes Father   . Sickle cell trait Daughter   . Anemia Daughter   . Thyroid disease Maternal Aunt   . Asthma Maternal Uncle   . Cancer Maternal Grandmother     lung; liver then all over body  . Kidney disease Maternal Grandfather   . Diabetes Maternal Grandfather    History  Substance Use Topics  . Smoking status: Never Smoker   . Smokeless tobacco:  Never Used  . Alcohol Use: No   OB History   Grav Para Term Preterm Abortions TAB SAB Ect Mult Living   Review of Systems  Constitutional: Negative for fever, chills and activity change.       All ROS Neg except as noted in HPI  HENT: Positive for congestion, rhinorrhea and sinus pressure. Negative for nosebleeds.   Eyes: Negative for photophobia and discharge.  Respiratory: Positive for cough and shortness of breath. Negative for wheezing.   Cardiovascular: Negative for chest pain and palpitations.  Gastrointestinal: Negative for abdominal pain and blood in stool.  Endocrine: Negative.   Genitourinary: Positive for menstrual problem. Negative for dysuria, frequency and hematuria.  Musculoskeletal: Negative for arthralgias, back pain and neck pain.  Skin: Negative.  Negative for rash.  Neurological: Negative for dizziness, seizures and speech difficulty.  Psychiatric/Behavioral: Negative for hallucinations and confusion. The patient is nervous/anxious.       Allergies  Compazine  Home Medications   Prior to Admission medications   Not on File   BP 131/83  Pulse 90  Temp(Src) 99.4 F (37.4 C) (Oral)  Resp 14  Ht  (1.651 m)  Wt 170 lb (77.111 kg)  BMI 28.29 kg/m2  SpO2 100%  LMP 02/03/2014 Physical Exam  Nursing note  reviewed. Constitutional: She is oriented to person, place, and time. She appears well-developed and well-nourished.  HENT:  Head: Normocephalic and atraumatic.  Mouth/Throat: Oropharynx is clear and moist.  Nasal Congestion.   Eyes: EOM are normal.  Neck: Normal range of motion. Neck supple.  Cardiovascular: Normal rate, regular rhythm, normal heart sounds and normal pulses.   Pulmonary/Chest: Effort normal and breath sounds normal.  Symmetrical rise and fall of the chest. Lungs clear.   Musculoskeletal: Normal range of motion.  No edema. Negative Homan's sign.   Lymphadenopathy:    She has no cervical adenopathy.   Neurological: She is alert and oriented to person, place, and time.  Skin: Skin is warm and dry.  Psychiatric: She has a normal mood and affect. Her behavior is normal.    ED Course  Procedures  DIAGNOSTIC STUDIES: Oxygen Saturation is 100% on RA, normal by my interpretation.  COORDINATION OF CARE: 2:46 PM-Discussed treatment plan which includes **Rx for afrin and prednisone.*  Labs Review Labs Reviewed - No data to display  Imaging Review No results found.   EKG Interpretation None      MDM  Exam suggest viral illness with sinusitis. No hemoptysis reported. No high fever. No rash.   Final diagnoses:  Acute sinusitis, recurrence not specified, unspecified location  URI (upper respiratory infection)    **I have reviewed nursing notes, vital signs, and all appropriate lab and imaging results for this patient.*  **I personally performed the services described in this documentation, which was scribed in my presence. The recorded information has been reviewed and is accurate.Kathie Dike, PA-C 02/24/14 [phone]

## 2014-02-24 NOTE — Discharge Instructions (Signed)
Upper Respiratory Infection, Adult An upper respiratory infection (URI) is also known as the common cold. It is often caused by a type of germ (virus). Colds are easily spread (contagious). You can pass it to others by kissing, coughing, sneezing, or drinking out of the same glass. Usually, you get better in 1 or 2 weeks.  HOME CARE   Only take medicine as told by your doctor.  Use a warm mist humidifier or breathe in steam from a hot shower.  Drink enough water and fluids to keep your pee (urine) clear or pale yellow.  Get plenty of rest.  Return to work when your temperature is back to normal or as told by your doctor. You may use a face mask and wash your hands to stop your cold from spreading. GET HELP RIGHT AWAY IF:   After the first few days, you feel you are getting worse.  You have questions about your medicine.  You have chills, shortness of breath, or brown or red spit (mucus).  You have yellow or brown snot (nasal discharge) or pain in the face, especially when you bend forward.  You have a fever, puffy (swollen) neck, pain when you swallow, or white spots in the back of your throat.  You have a bad headache, ear pain, sinus pain, or chest pain.  You have a high-pitched whistling sound when you breathe in and out (wheezing).  You have a lasting cough or cough up blood.  You have sore muscles or a stiff neck. MAKE SURE YOU:   Understand these instructions.  Will watch your condition.  Will get help right away if you are not doing well or get worse. Document Released: 11/16/2007 Document Revised: 08/22/2011 Document Reviewed: 09/04/2013 ExitCare Patient Information 2015 [employer]. This information is not intended to replace advice given to you by your health care provider. Make sure you discuss any questions you have with your health care provider.  

## 2014-02-24 NOTE — ED Notes (Signed)
Sob for 2 days, feels hoarse.  Nasal congestion, with sinus drainage.

## 2014-02-25 NOTE — ED Provider Notes (Signed)
History/physical exam/procedure(s) were performed by non-physician practitioner and as supervising physician I was immediately available for consultation/collaboration. I have reviewed all notes and am in agreement with care and plan.   Skylin Kennerson S Shelena Castelluccio, MD 02/25/14 2318 

## 2014-03-04 ENCOUNTER — Ambulatory Visit: Payer: 59 | Admitting: Family Medicine

## 2014-03-06 NOTE — Telephone Encounter (Signed)
Several attempts have been made to contact patient. No answer

## 2014-04-14 ENCOUNTER — Ambulatory Visit: Payer: 59 | Admitting: Adult Health

## 2014-04-14 ENCOUNTER — Encounter (HOSPITAL_COMMUNITY): Payer: Self-pay | Admitting: Emergency Medicine

## 2014-05-28 ENCOUNTER — Encounter (HOSPITAL_COMMUNITY): Payer: Self-pay | Admitting: *Deleted

## 2014-05-28 ENCOUNTER — Emergency Department (HOSPITAL_COMMUNITY)
Admission: EM | Admit: 2014-05-28 | Discharge: 2014-05-28 | Disposition: A | Discharge status: Home / Self Care | Payer: 59 | Attending: Emergency Medicine | Admitting: Emergency Medicine

## 2014-05-28 DIAGNOSIS — R200 Anesthesia of skin: Secondary | ICD-10-CM | POA: Diagnosis not present

## 2014-05-28 DIAGNOSIS — Z8742 Personal history of other diseases of the female genital tract: Secondary | ICD-10-CM | POA: Insufficient documentation

## 2014-05-28 DIAGNOSIS — Z8659 Personal history of other mental and behavioral disorders: Secondary | ICD-10-CM | POA: Insufficient documentation

## 2014-05-28 DIAGNOSIS — R0789 Other chest pain: Secondary | ICD-10-CM | POA: Insufficient documentation

## 2014-05-28 DIAGNOSIS — Z7952 Long term (current) use of systemic steroids: Secondary | ICD-10-CM | POA: Insufficient documentation

## 2014-05-28 DIAGNOSIS — Z3202 Encounter for pregnancy test, result negative: Secondary | ICD-10-CM | POA: Insufficient documentation

## 2014-05-28 DIAGNOSIS — R5383 Other fatigue: Secondary | ICD-10-CM | POA: Insufficient documentation

## 2014-05-28 DIAGNOSIS — Z8619 Personal history of other infectious and parasitic diseases: Secondary | ICD-10-CM | POA: Diagnosis not present

## 2014-05-28 DIAGNOSIS — R42 Dizziness and giddiness: Secondary | ICD-10-CM | POA: Diagnosis present

## 2014-05-28 LAB — CBC WITH DIFFERENTIAL/PLATELET
BASOS ABS: 0 10*3/uL (ref 0.0–0.1)
BASOS PCT: 0 % (ref 0–1)
EOS ABS: 0.1 10*3/uL (ref 0.0–0.7)
Eosinophils Relative: 1 % (ref 0–5)
HEMATOCRIT: 38.9 % (ref 36.0–46.0)
Hemoglobin: 12.1 g/dL (ref 12.0–15.0)
Lymphocytes Relative: 29 % (ref 12–46)
Lymphs Abs: 2 10*3/uL (ref 0.7–4.0)
MCH: 26.1 pg (ref 26.0–34.0)
MCHC: 31.1 g/dL (ref 30.0–36.0)
MCV: 84 fL (ref 78.0–100.0)
MONO ABS: 0.5 10*3/uL (ref 0.1–1.0)
Monocytes Relative: 8 % (ref 3–12)
NEUTROS ABS: 4.3 10*3/uL (ref 1.7–7.7)
Neutrophils Relative %: 62 % (ref 43–77)
Platelets: 194 10*3/uL (ref 150–400)
RBC: 4.63 MIL/uL (ref 3.87–5.11)
RDW: 13.7 % (ref 11.5–15.5)
WBC: 6.8 10*3/uL (ref 4.0–10.5)

## 2014-05-28 LAB — COMPREHENSIVE METABOLIC PANEL
ALBUMIN: 3.7 g/dL (ref 3.5–5.2)
ALK PHOS: 46 U/L (ref 39–117)
ALT: 14 U/L (ref 0–35)
ANION GAP: 9 (ref 5–15)
AST: 19 U/L (ref 0–37)
BILIRUBIN TOTAL: 0.4 mg/dL (ref 0.3–1.2)
BUN: 6 mg/dL (ref 6–23)
CHLORIDE: 105 meq/L (ref 96–112)
CO2: 26 mEq/L (ref 19–32)
Calcium: 9.3 mg/dL (ref 8.4–10.5)
Creatinine, Ser: 0.84 mg/dL (ref 0.50–1.10)
GFR calc non Af Amer: 90 mL/min — ABNORMAL LOW (ref >90)
GLUCOSE: 78 mg/dL (ref 70–99)
POTASSIUM: 3.9 meq/L (ref 3.7–5.3)
Sodium: 140 mEq/L (ref 137–147)
Total Protein: 7.2 g/dL (ref 6.0–8.3)

## 2014-05-28 LAB — URINALYSIS, ROUTINE W REFLEX MICROSCOPIC
BILIRUBIN URINE: NEGATIVE
Glucose, UA: NEGATIVE mg/dL
Hgb urine dipstick: NEGATIVE
KETONES UR: NEGATIVE mg/dL
LEUKOCYTES UA: NEGATIVE
NITRITE: NEGATIVE
Protein, ur: NEGATIVE mg/dL
Specific Gravity, Urine: >1.03 — ABNORMAL HIGH (ref 1.005–1.030)
UROBILINOGEN UA: 0.2 mg/dL (ref 0.0–1.0)
pH: 6 (ref 5.0–8.0)

## 2014-05-28 LAB — PREGNANCY, URINE: Preg Test, Ur: NEGATIVE

## 2014-05-28 MED ORDER — SODIUM CHLORIDE 0.9 % IV BOLUS (SEPSIS)
1000.0000 mL | Freq: Once | INTRAVENOUS | Status: AC
  Administered 2014-05-28: 1000 mL via INTRAVENOUS

## 2014-05-28 NOTE — ED Notes (Signed)
Discharge instructions reviewed with pt, questions answered. Pt verbalized understanding.  

## 2014-05-28 NOTE — ED Notes (Addendum)
Numbness to both arms and hands at times. Pt states pain to back of the head. Dizziness. Feeling jittery and pt states chest feels funny at times. SOB at times. Pt states symptoms began 2 days ago.

## 2014-05-28 NOTE — Discharge Instructions (Signed)
As discussed, your evaluation today has been largely reassuring.  But, it is important that you monitor your condition carefully, and do not hesitate to return to the ED if you develop new, or concerning changes in your condition. ? ?Otherwise, please follow-up with your physician for appropriate ongoing care. ? ?

## 2014-05-28 NOTE — ED Provider Notes (Signed)
CSN: [id number]     Arrival date & time 05/28/14  1209 History  This chart was scribe for Gerhard Munchobert Bonetta Mostek, MD by Angelene GiovanniEmmanuella Mensah, ED Scribe. The patient was seen in room APA11/APA11 and the patient's care was started at 1:12 PM.    Chief Complaint  Patient presents with  . multiple complaints.    The history is provided by the patient. No language interpreter was used.   HPI Comments: Carol Wong is a 33 y.o. female who presents to the Emergency Department complaining of dizziness, headache, chest pain, fatigue and numbness in arms onset 3 days ago. She reports associated nausea. She denies any fever , vomiting, or abdominal pain. She explains that the dizziness was her first symptom among the multiple complains. She denies taking any medication PTA or any past medical problems. She denies alcohol use or smoking. She denies any recent travel or sick contacts. She also denies alcohol use or smoking.   Past Medical History  Diagnosis Date  . Panic attacks   . Vaginal discharge 06/24/2013  . BV (bacterial vaginosis) 06/24/2013  . Allergy   . Irregular periods 02/12/2014   Past Surgical History  Procedure Laterality Date  . Tubal ligation    . Cholecystectomy    . Cyst removal hand Left    Family History  Problem Relation Age of Onset  . Hypertension Mother   . Diabetes Mother   . Hypertension Father   . Diabetes Father   . Sickle cell trait Daughter   . Anemia Daughter   . Thyroid disease Maternal Aunt   . Asthma Maternal Uncle   . Cancer Maternal Grandmother     lung; liver then all over body  . Kidney disease Maternal Grandfather   . Diabetes Maternal Grandfather    History  Substance Use Topics  . Smoking status: Never Smoker   . Smokeless tobacco: Never Used  . Alcohol Use: No   OB History    Gravida Para Term Preterm AB TAB SAB Ectopic Multiple Living   3 2   1  1   2      Review of Systems  Constitutional: Positive for fatigue. Negative for fever.       Per  HPI, otherwise negative  HENT:       Per HPI, otherwise negative  Respiratory:       Per HPI, otherwise negative  Cardiovascular: Positive for chest pain.       Per HPI, otherwise negative  Gastrointestinal: Negative for nausea, vomiting, abdominal pain and diarrhea.  Endocrine:       Negative aside from HPI  Genitourinary:       Neg aside from HPI   Musculoskeletal:       Per HPI, otherwise negative  Skin: Negative.   Neurological: Positive for dizziness. Negative for syncope.      Allergies  Compazine  Home Medications   Prior to Admission medications   Medication Sig Start Date End Date Taking? Authorizing Provider  predniSONE (DELTASONE) 10 MG tablet Take 2 tablets (20 mg total) by mouth daily. 02/24/14   Kathie DikeHobson M Bryant, PA-C   BP 119/75 mmHg  Pulse 87  Temp(Src) 98.7 F (37.1 C) (Oral)  Resp 18  Ht 5\' 5"  (1.651 m)  Wt 163 lb (73.936 kg)  BMI 27.12 kg/m2  SpO2 100%  LMP 05/27/2014 Physical Exam  Constitutional: She is oriented to person, place, and time. She appears well-developed and well-nourished. No distress.  HENT:  Head: Normocephalic and  atraumatic.  Mouth/Throat: Oropharynx is clear and moist.  Eyes: Conjunctivae and EOM are normal.  Cardiovascular: Normal rate and regular rhythm.   Pulmonary/Chest: Effort normal and breath sounds normal. No stridor. No respiratory distress.  Abdominal: Soft. She exhibits no distension.  Musculoskeletal: She exhibits no edema.  Neurological: She is alert and oriented to person, place, and time. No cranial nerve deficit. She exhibits normal muscle tone. Coordination normal.  Skin: Skin is warm and dry.  Psychiatric: She has a normal mood and affect. Her behavior is normal.  Nursing note and vitals reviewed.   ED Course  Procedures (including critical care time) DIAGNOSTIC STUDIES: Oxygen Saturation is 100% on RA, normal by my interpretation.    COORDINATION OF CARE: 1:17 PM- Pt advised of plan for treatment and  pt agrees.    Labs Review Labs Reviewed  COMPREHENSIVE METABOLIC PANEL - Abnormal; Notable for the following:    GFR calc non Af Amer 90 (*)    All other components within normal limits  URINALYSIS, ROUTINE W REFLEX MICROSCOPIC - Abnormal; Notable for the following:    Specific Gravity, Urine >1.030 (*)    All other components within normal limits  CBC WITH DIFFERENTIAL  PREGNANCY, URINE    3:18 PM Patient states that she feels better. She has a follow-up appointment in 6 days with primary care. We discussed return precautions, follow-up instructions. Over the interval, the patient will keep a log of her illness, characteristics, to take to her physician's office.  MDM   Patient presents with multiple complaints. This illness seems to began several days ago, with generalized illness. On exam patient is awake, alert, hemodynamically stable, neurologically intact, in no distress. Patient improved with IV fluid rehydration, suggesting possible dehydration is contributory Patient's evaluation is reassuring, with no current evidence for sepsis, bacteremia, neurologic compromise, ongoing coronary ischemia, or other acute pathology. Patient was discharged in stable condition to follow-up with primary care in 6 days.  I personally performed the services described in this documentation, which was scribed in my presence. The recorded information has been reviewed and is accurate.     Gerhard Munchobert Keili Hasten, MD 05/28/14 [phone]

## 2014-07-08 ENCOUNTER — Other Ambulatory Visit: Payer: Medicaid Other | Admitting: Adult Health

## 2014-07-10 ENCOUNTER — Other Ambulatory Visit (HOSPITAL_COMMUNITY)
Admission: RE | Admit: 2014-07-10 | Discharge: 2014-07-10 | Disposition: A | Discharge status: Home / Self Care | Payer: Medicaid Other | Source: Ambulatory Visit | Attending: Adult Health | Admitting: Adult Health

## 2014-07-10 ENCOUNTER — Ambulatory Visit (INDEPENDENT_AMBULATORY_CARE_PROVIDER_SITE_OTHER): Payer: Medicaid Other | Admitting: Adult Health

## 2014-07-10 ENCOUNTER — Encounter: Payer: Self-pay | Admitting: Adult Health

## 2014-07-10 VITALS — BP 110/78 | HR 78 | Ht 65.25 in | Wt 165.5 lb

## 2014-07-10 DIAGNOSIS — Z0000 Encounter for general adult medical examination without abnormal findings: Secondary | ICD-10-CM

## 2014-07-10 DIAGNOSIS — Z01419 Encounter for gynecological examination (general) (routine) without abnormal findings: Secondary | ICD-10-CM | POA: Diagnosis present

## 2014-07-10 DIAGNOSIS — Z1151 Encounter for screening for human papillomavirus (HPV): Secondary | ICD-10-CM | POA: Diagnosis present

## 2014-07-10 DIAGNOSIS — Z113 Encounter for screening for infections with a predominantly sexual mode of transmission: Secondary | ICD-10-CM | POA: Diagnosis present

## 2014-07-10 NOTE — Patient Instructions (Signed)
Generalized Anxiety Disorder Generalized anxiety disorder (GAD) is a mental disorder. It interferes with life functions, including relationships, work, and school. GAD is different from normal anxiety, which everyone experiences at some point in their lives in response to specific life events and activities. Normal anxiety actually helps us prepare for and get through these life events and activities. Normal anxiety goes away after the event or activity is over.  GAD causes anxiety that is not necessarily related to specific events or activities. It also causes excess anxiety in proportion to specific events or activities. The anxiety associated with GAD is also difficult to control. GAD can vary from mild to severe. People with severe GAD can have intense waves of anxiety with physical symptoms (panic attacks).  SYMPTOMS The anxiety and worry associated with GAD are difficult to control. This anxiety and worry are related to many life events and activities and also occur more days than not for 6 months or longer. People with GAD also have three or more of the following symptoms (one or more in children):  Restlessness.   Fatigue.  Difficulty concentrating.   Irritability.  Muscle tension.  Difficulty sleeping or unsatisfying sleep. DIAGNOSIS GAD is diagnosed through an assessment by your health care provider. Your health care provider will ask you questions aboutyour mood,physical symptoms, and events in your life. Your health care provider may ask you about your medical history and use of alcohol or drugs, including prescription medicines. Your health care provider may also do a physical exam and blood tests. Certain medical conditions and the use of certain substances can cause symptoms similar to those associated with GAD. Your health care provider may refer you to a mental health specialist for further evaluation. TREATMENT The following therapies are usually used to treat GAD:    Medication. Antidepressant medication usually is prescribed for long-term daily control. Antianxiety medicines may be added in severe cases, especially when panic attacks occur.   Talk therapy (psychotherapy). Certain types of talk therapy can be helpful in treating GAD by providing support, education, and guidance. A form of talk therapy called cognitive behavioral therapy can teach you healthy ways to think about and react to daily life events and activities.  Stress managementtechniques. These include yoga, meditation, and exercise and can be very helpful when they are practiced regularly. A mental health specialist can help determine which treatment is best for you. Some people see improvement with one therapy. However, other people require a combination of therapies. Document Released: 09/24/2012 Document Revised: 10/14/2013 Document Reviewed: 09/24/2012 [facility] Patient Information 2015 [place], [place]. This information is not intended to replace advice given to you by your health care provider. Make sure you discuss any questions you have with your health care provider. Physical in 1 year Mammogram at 1340

## 2014-07-10 NOTE — Progress Notes (Signed)
Patient ID: Carol MacleodLatonya R Wong, female   DOB: 03/24/1981, 34 y.o.   MRN: [id number] History of Present Illness:  Carol LaundryLatonya is a 34 year old black female in for pap and physical.  Current Medications, Allergies, Past Medical History, Past Surgical History, Family History and Social History were [facility].     Review of Systems: Patient denies any headaches, blurred vision, shortness of breath, chest pain, abdominal pain, problems with bowel movements, urination, or intercourse. No joint pain, has variation in period cycle,but no problem, feels anxious at times and has for years, just worries over stuff,declines meds.    Physical Exam:BP 110/78 mmHg  Pulse 78  Ht 5' 5.25" (1.657 m)  Wt 165 lb 8 oz (75.07 kg)  BMI 27.34 kg/m2  LMP 06/17/2014 General:  Well developed, well nourished, no acute distress Skin:  Warm and dry Neck:  Midline trachea, normal thyroid Lungs; Clear to auscultation bilaterally Breast:  No dominant palpable mass, retraction, or nipple discharge Cardiovascular: Regular rate and rhythm Abdomen:  Soft, non tender, no hepatosplenomegaly Pelvic:  External genitalia is normal in appearance, no lesions.  The vagina is normal in appearance.      The cervix is bulbous, pap with HPV and GC/CHL performed.  Uterus is felt to be normal size, shape, and contour.  No  adnexal masses or tenderness noted. Extremities:  No swelling or varicosities noted Psych:  No mood changes,alert and cooperative,seems happy Has had labs recently.  Impression: Well woman gyn exam with pap    Plan: Physical in 1 year Mammogram at 39  Review handout on anxiety

## 2014-07-11 LAB — CYTOLOGY - PAP

## 2015-02-04 ENCOUNTER — Ambulatory Visit: Payer: Medicaid Other | Admitting: Adult Health

## 2015-08-07 ENCOUNTER — Encounter: Payer: Self-pay | Admitting: Adult Health

## 2015-08-07 ENCOUNTER — Ambulatory Visit (INDEPENDENT_AMBULATORY_CARE_PROVIDER_SITE_OTHER): Payer: Medicaid Other | Admitting: Adult Health

## 2015-08-07 VITALS — BP 120/80 | HR 84 | Ht 65.0 in | Wt 176.0 lb

## 2015-08-07 DIAGNOSIS — N946 Dysmenorrhea, unspecified: Secondary | ICD-10-CM | POA: Diagnosis not present

## 2015-08-07 DIAGNOSIS — Z01419 Encounter for gynecological examination (general) (routine) without abnormal findings: Secondary | ICD-10-CM | POA: Diagnosis not present

## 2015-08-07 DIAGNOSIS — Z0000 Encounter for general adult medical examination without abnormal findings: Secondary | ICD-10-CM | POA: Diagnosis not present

## 2015-08-07 DIAGNOSIS — N898 Other specified noninflammatory disorders of vagina: Secondary | ICD-10-CM

## 2015-08-07 HISTORY — DX: Dysmenorrhea, unspecified: N94.6

## 2015-08-07 LAB — POCT WET PREP (WET MOUNT)
Clue Cells Wet Prep Whiff POC: NEGATIVE
WBC, Wet Prep HPF POC: POSITIVE

## 2015-08-07 MED ORDER — NORETHIN-ETH ESTRAD-FE BIPHAS 1 MG-10 MCG / 10 MCG PO TABS: 1.0000 | ORAL_TABLET | Freq: Every day | ORAL | Status: DC

## 2015-08-07 NOTE — Patient Instructions (Signed)
Start lo loestrin with next period Follow up in 3 months and fasting labs  Physical in 1 year, pap in 2019 Use condoms

## 2015-08-07 NOTE — Progress Notes (Signed)
Patient ID: Carol Wong, female   DOB: 06-Feb-1981, 35 y.o.   MRN: [id number] History of Present Illness: Carol Wong is a 35 year old white female, single, in for a well woman gyn exam, she had a normal pap with negative HPV 07/10/14.She complains of stomach cramps the first 2 days of her period, periods last about 5 days and not heavy, but advil no help.She has had a tubal and uses condoms.   Current Medications, Allergies, Past Medical History, Past Surgical History, Family History and Social History were reviewed in [name] record.     Review of Systems: Patient denies any headaches, hearing loss, fatigue, blurred vision, shortness of breath, chest pain,  problems with bowel movements, urination, or intercourse. No joint pain or mood swings. See HPI for positives.  Physical Exam:BP 120/80 mmHg  Pulse 84  Ht  (1.651 m)  Wt 176 lb (79.833 kg)  BMI 29.29 kg/m2  LMP 07/27/2015 General:  Well developed, well nourished, no acute distress Skin:  Warm and dry Neck:  Midline trachea, normal thyroid, good ROM, no lymphadenopathy Lungs; Clear to auscultation bilaterally Breast:  No dominant palpable mass, retraction, or nipple discharge Cardiovascular: Regular rate and rhythm Abdomen:  Soft, non tender, no hepatosplenomegaly Pelvic:  External genitalia is normal in appearance, no lesions.  The vagina is normal in appearance, has thin watery discharge, wet prep showed +WBC and dead sperm. Urethra has no lesions or masses. The cervix is bulbous.  Uterus is felt to be normal size, shape, and contour.  No adnexal masses or tenderness noted.Bladder is non tender, no masses felt. Extremities/musculoskeletal:  No swelling or varicosities noted, no clubbing or cyanosis Psych:  No mood changes, alert and cooperative,seems happy Discussed trying anaprox or low dose pill and she wants to try the pill.She declines STD testing today.  Impression:  Well woman gyn exam no  pap Dysmenorrhea    Plan: Rx lo loestrin disp 1 pack take 1 daily with 11 refills Follow up in 3 months for ROS and fasting labs Physical in 1 year, pap 2019

## 2015-11-04 ENCOUNTER — Ambulatory Visit: Payer: Medicaid Other | Admitting: Adult Health

## 2016-08-12 ENCOUNTER — Encounter: Payer: Self-pay | Admitting: Adult Health

## 2016-08-12 ENCOUNTER — Ambulatory Visit (INDEPENDENT_AMBULATORY_CARE_PROVIDER_SITE_OTHER): Payer: Medicaid Other | Admitting: Adult Health

## 2016-08-12 ENCOUNTER — Other Ambulatory Visit (HOSPITAL_COMMUNITY)
Admission: RE | Admit: 2016-08-12 | Discharge: 2016-08-12 | Disposition: A | Discharge status: Home / Self Care | Payer: Medicaid Other | Source: Ambulatory Visit | Attending: Adult Health | Admitting: Adult Health

## 2016-08-12 VITALS — BP 122/80 | HR 96 | Ht 66.0 in | Wt 189.5 lb

## 2016-08-12 DIAGNOSIS — Z0000 Encounter for general adult medical examination without abnormal findings: Secondary | ICD-10-CM

## 2016-08-12 DIAGNOSIS — Z01419 Encounter for gynecological examination (general) (routine) without abnormal findings: Secondary | ICD-10-CM | POA: Insufficient documentation

## 2016-08-12 DIAGNOSIS — Z1151 Encounter for screening for human papillomavirus (HPV): Secondary | ICD-10-CM | POA: Insufficient documentation

## 2016-08-12 NOTE — Patient Instructions (Signed)
Physical in 1 year Pap in 3 if normal 
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
## 2016-08-12 NOTE — Addendum Note (Signed)
Addended by: Colen DarlingYOUNG, Garren Greenman S on: 08/12/2016 10:25 AM   Modules accepted: Orders

## 2016-08-13 LAB — COMPREHENSIVE METABOLIC PANEL
ALBUMIN: 4.3 g/dL (ref 3.5–5.5)
ALK PHOS: 56 IU/L (ref 39–117)
ALT: 34 IU/L — ABNORMAL HIGH (ref 0–32)
AST: 38 IU/L (ref 0–40)
Albumin/Globulin Ratio: 1.4 (ref 1.2–2.2)
BUN/Creatinine Ratio: 10 (ref 9–23)
BUN: 9 mg/dL (ref 6–20)
Bilirubin Total: 0.3 mg/dL (ref 0.0–1.2)
CALCIUM: 9.6 mg/dL (ref 8.7–10.2)
CO2: 19 mmol/L (ref 18–29)
CREATININE: 0.89 mg/dL (ref 0.57–1.00)
Chloride: 104 mmol/L (ref 96–106)
GFR calc Af Amer: 96 mL/min/{1.73_m2} (ref >59)
GFR, EST NON AFRICAN AMERICAN: 84 mL/min/{1.73_m2} (ref >59)
GLOBULIN, TOTAL: 3.1 g/dL (ref 1.5–4.5)
GLUCOSE: 120 mg/dL — AB (ref 65–99)
Potassium: 4.4 mmol/L (ref 3.5–5.2)
SODIUM: 141 mmol/L (ref 134–144)
Total Protein: 7.4 g/dL (ref 6.0–8.5)

## 2016-08-13 LAB — LIPID PANEL
Chol/HDL Ratio: 2 ratio units (ref 0.0–4.4)
Cholesterol, Total: 159 mg/dL (ref 100–199)
HDL: 79 mg/dL (ref >39)
LDL CALC: 72 mg/dL (ref 0–99)
Triglycerides: 42 mg/dL (ref 0–149)
VLDL Cholesterol Cal: 8 mg/dL (ref 5–40)

## 2016-08-13 LAB — CBC
HEMOGLOBIN: 12.7 g/dL (ref 11.1–15.9)
Hematocrit: 38.8 % (ref 34.0–46.6)
MCH: 25.9 pg — ABNORMAL LOW (ref 26.6–33.0)
MCHC: 32.7 g/dL (ref 31.5–35.7)
MCV: 79 fL (ref 79–97)
Platelets: 224 10*3/uL (ref 150–379)
RBC: 4.91 x10E6/uL (ref 3.77–5.28)
RDW: 14.8 % (ref 12.3–15.4)
WBC: 5.1 10*3/uL (ref 3.4–10.8)

## 2016-08-13 LAB — TSH: TSH: 1.14 u[IU]/mL (ref 0.450–4.500)

## 2016-08-15 ENCOUNTER — Telehealth: Payer: Self-pay | Admitting: Adult Health

## 2016-08-15 NOTE — Telephone Encounter (Signed)
Pt aware of labs and that glucose 120 which is high since was fasting, will get back in 3/28 at 8:30 for repeat CMP and A1c,thyroid normal and lipid panel is excellent

## 2016-08-16 LAB — CYTOLOGY - PAP
Adequacy: ABSENT
DIAGNOSIS: NEGATIVE
HPV: NOT DETECTED

## 2016-09-07 ENCOUNTER — Ambulatory Visit (INDEPENDENT_AMBULATORY_CARE_PROVIDER_SITE_OTHER): Payer: Medicaid Other | Admitting: Adult Health

## 2016-09-07 ENCOUNTER — Encounter: Payer: Self-pay | Admitting: Adult Health

## 2016-09-07 VITALS — BP 120/82 | HR 87 | Ht 65.0 in | Wt 189.0 lb

## 2016-09-07 DIAGNOSIS — R739 Hyperglycemia, unspecified: Secondary | ICD-10-CM

## 2016-09-07 DIAGNOSIS — F410 Panic disorder [episodic paroxysmal anxiety] without agoraphobia: Secondary | ICD-10-CM

## 2016-09-07 DIAGNOSIS — F430 Acute stress reaction: Secondary | ICD-10-CM | POA: Diagnosis not present

## 2016-09-07 NOTE — Progress Notes (Signed)
Subjective:     Patient ID: Derald MacleodLatonya R Edinger, female   DOB: 03/06/1981, 36 y.o.   MRN: [id number]  HPI Phill MyronLatinya is a 36 year old black female in for repeat fasting CMP and A1c, and she had panic attack this weekend with shortness of breath and chest discomfort after having been to 2 funerals and family stress, this is first attack in about 3 years.  Review of Systems Had recent panic attack, with shortness of breath and chest discomfort the weekend Reviewed past medical,surgical, social and family history. Reviewed medications and allergies.     Objective:   Physical Exam BP 120/82 (BP Location: Left Arm, Patient Position: Sitting, Cuff Size: Normal)   Pulse 87   Ht 5\' 5"  (1.651 m)   Wt 189 lb (85.7 kg)   LMP 08/24/2016 (Exact Date)   BMI 31.45 kg/m  Skin warm and dry. Lungs: clear to ausculation bilaterally. Cardiovascular: regular rate and rhythm.   Discussed taking deep breaths and exercise, for panic attacks, since declines meds.  Assessment:     1. Elevated blood sugar   2. Panic attack as reaction to stress       Plan:     Check CMP and A1c Follow up prn

## 2016-09-08 LAB — COMPREHENSIVE METABOLIC PANEL
A/G RATIO: 1.4 (ref 1.2–2.2)
ALK PHOS: 62 IU/L (ref 39–117)
ALT: 23 IU/L (ref 0–32)
AST: 24 IU/L (ref 0–40)
Albumin: 4.1 g/dL (ref 3.5–5.5)
BUN/Creatinine Ratio: 13 (ref 9–23)
BUN: 11 mg/dL (ref 6–20)
Bilirubin Total: 0.3 mg/dL (ref 0.0–1.2)
CHLORIDE: 102 mmol/L (ref 96–106)
CO2: 24 mmol/L (ref 18–29)
Calcium: 9.1 mg/dL (ref 8.7–10.2)
Creatinine, Ser: 0.86 mg/dL (ref 0.57–1.00)
GFR calc Af Amer: 101 mL/min/{1.73_m2} (ref >59)
GFR calc non Af Amer: 87 mL/min/{1.73_m2} (ref >59)
Globulin, Total: 2.9 g/dL (ref 1.5–4.5)
Glucose: 108 mg/dL — ABNORMAL HIGH (ref 65–99)
POTASSIUM: 4.5 mmol/L (ref 3.5–5.2)
Sodium: 140 mmol/L (ref 134–144)
Total Protein: 7 g/dL (ref 6.0–8.5)

## 2016-09-08 LAB — HEMOGLOBIN A1C
ESTIMATED AVERAGE GLUCOSE: 123 mg/dL
Hgb A1c MFr Bld: 5.9 % — ABNORMAL HIGH (ref 4.8–5.6)

## 2016-09-12 ENCOUNTER — Telehealth: Payer: Self-pay | Admitting: Adult Health

## 2016-09-12 NOTE — Telephone Encounter (Signed)
Pt aware of labs, and need to cut carbs and increase exercise, to decrease blood sugar

## 2016-09-13 ENCOUNTER — Telehealth: Payer: Self-pay | Admitting: *Deleted

## 2016-09-14 NOTE — Telephone Encounter (Signed)
Pt aware pap negative for malignancy and negative HPV

## 2016-12-07 ENCOUNTER — Ambulatory Visit: Payer: Medicaid Other | Admitting: Advanced Practice Midwife

## 2016-12-26 ENCOUNTER — Telehealth: Payer: Self-pay | Admitting: Adult Health

## 2016-12-26 MED ORDER — METRONIDAZOLE 500 MG PO TABS: 500.0000 mg | ORAL_TABLET | Freq: Two times a day (BID) | ORAL | 0 refills | Status: DC

## 2016-12-26 NOTE — Telephone Encounter (Signed)
Pt working 10 hours and thinks she has BV, will rx flagyl.

## 2017-01-31 ENCOUNTER — Emergency Department (HOSPITAL_COMMUNITY)
Admission: EM | Admit: 2017-01-31 | Discharge: 2017-01-31 | Disposition: A | Discharge status: Home / Self Care | Payer: Medicaid Other

## 2017-01-31 NOTE — ED Triage Notes (Signed)
Called name ,.  No answer  

## 2017-01-31 NOTE — ED Triage Notes (Signed)
Called name .  No answer

## 2017-09-25 ENCOUNTER — Other Ambulatory Visit (HOSPITAL_COMMUNITY)
Admission: RE | Admit: 2017-09-25 | Discharge: 2017-09-25 | Disposition: A | Discharge status: Home / Self Care | Payer: Medicaid Other | Source: Ambulatory Visit | Attending: Adult Health | Admitting: Adult Health

## 2017-09-25 ENCOUNTER — Encounter: Payer: Self-pay | Admitting: Adult Health

## 2017-09-25 ENCOUNTER — Ambulatory Visit (INDEPENDENT_AMBULATORY_CARE_PROVIDER_SITE_OTHER): Payer: Medicaid Other | Admitting: Adult Health

## 2017-09-25 VITALS — BP 140/70 | HR 86 | Ht 65.5 in | Wt 194.0 lb

## 2017-09-25 DIAGNOSIS — R739 Hyperglycemia, unspecified: Secondary | ICD-10-CM | POA: Diagnosis not present

## 2017-09-25 DIAGNOSIS — Z0001 Encounter for general adult medical examination with abnormal findings: Secondary | ICD-10-CM

## 2017-09-25 DIAGNOSIS — Z01419 Encounter for gynecological examination (general) (routine) without abnormal findings: Secondary | ICD-10-CM

## 2017-09-25 DIAGNOSIS — Z13220 Encounter for screening for lipoid disorders: Secondary | ICD-10-CM | POA: Diagnosis not present

## 2017-09-25 DIAGNOSIS — Z124 Encounter for screening for malignant neoplasm of cervix: Secondary | ICD-10-CM

## 2017-09-25 DIAGNOSIS — R635 Abnormal weight gain: Secondary | ICD-10-CM | POA: Diagnosis not present

## 2017-09-25 DIAGNOSIS — R87810 Cervical high risk human papillomavirus (HPV) DNA test positive: Secondary | ICD-10-CM | POA: Insufficient documentation

## 2017-09-25 DIAGNOSIS — Z1321 Encounter for screening for nutritional disorder: Secondary | ICD-10-CM

## 2017-09-25 DIAGNOSIS — Z131 Encounter for screening for diabetes mellitus: Secondary | ICD-10-CM
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
## 2017-09-26 ENCOUNTER — Encounter: Payer: Self-pay | Admitting: Adult Health

## 2017-09-26 ENCOUNTER — Telehealth: Payer: Self-pay | Admitting: Adult Health

## 2017-09-26 DIAGNOSIS — R7309 Other abnormal glucose: Secondary | ICD-10-CM

## 2017-09-26 DIAGNOSIS — E559 Vitamin D deficiency, unspecified: Secondary | ICD-10-CM

## 2017-09-26 HISTORY — DX: Vitamin D deficiency, unspecified: E55.9

## 2017-09-26 HISTORY — DX: Other abnormal glucose: R73.09

## 2017-09-26 LAB — COMPREHENSIVE METABOLIC PANEL
ALT: 18 IU/L (ref 0–32)
AST: 28 IU/L (ref 0–40)
Albumin/Globulin Ratio: 1.4 (ref 1.2–2.2)
Albumin: 4.3 g/dL (ref 3.5–5.5)
Alkaline Phosphatase: 55 IU/L (ref 39–117)
BUN/Creatinine Ratio: 10 (ref 9–23)
BUN: 10 mg/dL (ref 6–20)
Bilirubin Total: 0.3 mg/dL (ref 0.0–1.2)
CO2: 21 mmol/L (ref 20–29)
Calcium: 9.1 mg/dL (ref 8.7–10.2)
Chloride: 106 mmol/L (ref 96–106)
Creatinine, Ser: 0.99 mg/dL (ref 0.57–1.00)
GFR calc Af Amer: 84 mL/min/{1.73_m2} (ref >59)
GFR calc non Af Amer: 73 mL/min/{1.73_m2} (ref >59)
GLUCOSE: 117 mg/dL — AB (ref 65–99)
Globulin, Total: 3 g/dL (ref 1.5–4.5)
Potassium: 4.8 mmol/L (ref 3.5–5.2)
Sodium: 141 mmol/L (ref 134–144)
Total Protein: 7.3 g/dL (ref 6.0–8.5)

## 2017-09-26 LAB — CBC
Hematocrit: 38.9 % (ref 34.0–46.6)
Hemoglobin: 12.2 g/dL (ref 11.1–15.9)
MCH: 25.4 pg — AB (ref 26.6–33.0)
MCHC: 31.4 g/dL — ABNORMAL LOW (ref 31.5–35.7)
MCV: 81 fL (ref 79–97)
PLATELETS: 229 10*3/uL (ref 150–379)
RBC: 4.81 x10E6/uL (ref 3.77–5.28)
RDW: 14.4 % (ref 12.3–15.4)
WBC: 5.8 10*3/uL (ref 3.4–10.8)

## 2017-09-26 LAB — CYTOLOGY - PAP
Chlamydia: NEGATIVE
Diagnosis: NEGATIVE
HPV (WINDOPATH): DETECTED — AB
NEISSERIA GONORRHEA: NEGATIVE

## 2017-09-26 LAB — LIPID PANEL
CHOLESTEROL TOTAL: 144 mg/dL (ref 100–199)
Chol/HDL Ratio: 1.9 ratio (ref 0.0–4.4)
HDL: 74 mg/dL (ref >39)
LDL Calculated: 63 mg/dL (ref 0–99)
TRIGLYCERIDES: 37 mg/dL (ref 0–149)
VLDL Cholesterol Cal: 7 mg/dL (ref 5–40)

## 2017-09-26 LAB — HEMOGLOBIN A1C
Est. average glucose Bld gHb Est-mCnc: 128 mg/dL
Hgb A1c MFr Bld: 6.1 % — ABNORMAL HIGH (ref 4.8–5.6)

## 2017-09-26 LAB — VITAMIN D 25 HYDROXY (VIT D DEFICIENCY, FRACTURES): Vit D, 25-Hydroxy: 26.9 ng/mL — ABNORMAL LOW (ref 30.0–100.0)

## 2017-09-26 LAB — TSH: TSH: 1.41 u[IU]/mL (ref 0.450–4.500)

## 2017-09-26 NOTE — Telephone Encounter (Signed)
Pt aware of labs, decrease bread and sweets, and take 2000 IU of vitamin D 3 daily,recheck in 1 year

## 2017-09-28 ENCOUNTER — Telehealth: Payer: Self-pay | Admitting: Adult Health

## 2017-09-28 ENCOUNTER — Encounter: Payer: Self-pay | Admitting: Adult Health

## 2017-09-28 DIAGNOSIS — R87810 Cervical high risk human papillomavirus (HPV) DNA test positive: Secondary | ICD-10-CM

## 2017-09-28 HISTORY — DX: Cervical high risk human papillomavirus (HPV) DNA test positive: R87.810

## 2017-09-28 NOTE — Telephone Encounter (Signed)
Pt aware Pap negative for malignancy and GC/CHL but +HPV, repeat in 1 year, use condoms

## 2018-03-05 DIAGNOSIS — M25532 Pain in left wrist: Secondary | ICD-10-CM | POA: Diagnosis not present

## 2018-03-05 DIAGNOSIS — M25531 Pain in right wrist: Secondary | ICD-10-CM | POA: Diagnosis not present

## 2018-03-05 DIAGNOSIS — G5603 Carpal tunnel syndrome, bilateral upper limbs: Secondary | ICD-10-CM | POA: Diagnosis not present

## 2018-09-27 ENCOUNTER — Other Ambulatory Visit: Payer: Self-pay | Admitting: Adult Health

## 2018-10-23 ENCOUNTER — Encounter: Payer: Self-pay | Admitting: Adult Health

## 2018-11-13 ENCOUNTER — Other Ambulatory Visit: Payer: Medicaid Other | Admitting: Adult Health

## 2018-12-13 ENCOUNTER — Other Ambulatory Visit: Payer: Medicaid Other | Admitting: Adult Health

## 2019-01-16 ENCOUNTER — Other Ambulatory Visit: Payer: Medicaid Other | Admitting: Adult Health

## 2019-04-04 ENCOUNTER — Telehealth: Payer: Self-pay | Admitting: Adult Health

## 2019-04-04 NOTE — Telephone Encounter (Signed)
Tried to reach the patient to remind her of her appointment/restrictions, wrong #. 

## 2019-04-08 ENCOUNTER — Other Ambulatory Visit: Payer: Medicaid Other | Admitting: Adult Health

## 2019-06-28 ENCOUNTER — Telehealth: Payer: Self-pay | Admitting: Adult Health

## 2019-06-28 NOTE — Telephone Encounter (Signed)
Called patient regarding appointment scheduled in our office and advised to come alone to the visit, however, a support person, over age 40, may accompany her to appointment if assistance is needed for safety or care concerns. Otherwise, support persons should remain outside until the visit is complete.   Prescreen questions asked: 1. Any of the following symptoms of COVID such as chills, fever, cough, shortness of breath, muscle pain, diarrhea, rash, vomiting, abdominal pain, red eye, weakness, bruising, bleeding, joint pain, loss of taste or smell, a severe headache, sore throat, fatigue 2. Any exposure to anyone suspected or confirmed of having COVID-19 3. Awaiting test results for COVID-19  Also,to keep you safe, please use the provided hand sanitizer when you enter the office. We are asking everyone in the office to wear a mask to help prevent the spread of germs. If you have a mask of your own, please wear it to your appointment, if not, we are happy to provide one for you.  Thank you for understanding and your cooperation.    [facility] Staff      

## 2019-07-01 ENCOUNTER — Ambulatory Visit (INDEPENDENT_AMBULATORY_CARE_PROVIDER_SITE_OTHER): Payer: Medicaid Other | Admitting: Adult Health

## 2019-07-01 ENCOUNTER — Encounter: Payer: Self-pay | Admitting: Adult Health

## 2019-07-01 ENCOUNTER — Other Ambulatory Visit: Payer: Self-pay

## 2019-07-01 ENCOUNTER — Other Ambulatory Visit (HOSPITAL_COMMUNITY)
Admission: RE | Admit: 2019-07-01 | Discharge: 2019-07-01 | Disposition: A | Discharge status: Home / Self Care | Payer: Medicaid Other | Source: Ambulatory Visit | Attending: Adult Health | Admitting: Adult Health

## 2019-07-01 VITALS — BP 126/75 | HR 83 | Ht 65.0 in | Wt 182.6 lb

## 2019-07-01 DIAGNOSIS — Z01419 Encounter for gynecological examination (general) (routine) without abnormal findings: Secondary | ICD-10-CM

## 2019-07-01 DIAGNOSIS — Z113 Encounter for screening for infections with a predominantly sexual mode of transmission: Secondary | ICD-10-CM

## 2019-07-01 DIAGNOSIS — K648 Other hemorrhoids: Secondary | ICD-10-CM

## 2019-07-01 DIAGNOSIS — Z0000 Encounter for general adult medical examination without abnormal findings: Secondary | ICD-10-CM | POA: Diagnosis not present

## 2019-07-01 DIAGNOSIS — K6289 Other specified diseases of anus and rectum: Secondary | ICD-10-CM | POA: Insufficient documentation

## 2019-07-01 LAB — POCT URINALYSIS DIPSTICK
Glucose, UA: NEGATIVE
Leukocytes, UA: NEGATIVE
Nitrite, UA: NEGATIVE
Protein, UA: NEGATIVE

## 2019-07-01 NOTE — Progress Notes (Signed)
Patient ID: Carol Wong, female   DOB: December 22, 1980, 39 y.o.   MRN: [id number] History of Present Illness: Carol Wong is a 39 year old black female, single, [id number], in for a well woman gyn exam and pap.She had +HPV 09/25/17. She is sp tubal.  PCP is Health dept[facility] wants to get private PCP, given Dr Carol Wong name.    Current Medications, Allergies, Past Medical History, Past Surgical History, Family History and Social History were reviewed in [facility] record.     Review of Systems: Patient denies any headaches, hearing loss, fatigue, blurred vision, shortness of breath, chest pain, abdominal pain, problems with bowel movements,(has rectal irritation, BMS are daily) urination, or intercourse. No joint pain or mood swings.   Physical Exam:BP 126/75 (BP Location: Left Arm, Patient Position: Sitting, Cuff Size: Normal)   Pulse 83   Ht 5\' 5"  (1.651 m)   Wt 182 lb 9.6 oz (82.8 kg)   LMP 06/26/2019 (Exact Date)   BMI 30.39 kg/m trace blood, probably from period General:  Well developed, well nourished, no acute distress Skin:  Warm and dry Neck:  Midline trachea, normal thyroid, good ROM, no lymphadenopathy Lungs; Clear to auscultation bilaterally Breast:  No dominant palpable mass, retraction, or nipple discharge Cardiovascular: Regular rate and rhythm Abdomen:  Soft, non tender, no hepatosplenomegaly Pelvic:  External genitalia is normal in appearance, no lesions.  The vagina is normal in appearance.+period blood. Urethra has no lesions or masses. The cervix is bulbous.Pap with GC/CHL and high risk HPV 16/18 genotyping performed.   Uterus is felt to be normal size, shape, and contour.  No adnexal masses or tenderness noted.Bladder is non tender, no masses felt. Rectal: Good sphincter tone, no polyps, + hemorrhoids felt at 12 0 'clock   Extremities/musculoskeletal:  No swelling or varicosities noted, no clubbing or cyanosis Psych:  No mood changes, alert and  cooperative,seems happy Fall risk is low Examination chaperoned by 06/28/2019 LPN.  Impression and Plan: 1. Encounter for gynecological examination with Papanicolaou smear of cervix Pap sent Physical in 1 year Pap in 3 if normal Check CBC,CMP,TSH and lipids Urine dipstick at her request  Get mammogram at 39, just call xray at [facility]  2. Rectal irritation Cleanse with wet wipe and try preparation H   3. Internal hemorrhoid Try Preparation H  4. Screening examination for STD (sexually transmitted disease) Check HIV and RPR

## 2019-07-02 ENCOUNTER — Telehealth: Payer: Self-pay | Admitting: *Deleted

## 2019-07-02 ENCOUNTER — Telehealth: Payer: Self-pay | Admitting: Adult Health

## 2019-07-02 LAB — CBC
Hematocrit: 40.2 % (ref 34.0–46.6)
Hemoglobin: 12.3 g/dL (ref 11.1–15.9)
MCH: 24.5 pg — ABNORMAL LOW (ref 26.6–33.0)
MCHC: 30.6 g/dL — ABNORMAL LOW (ref 31.5–35.7)
MCV: 80 fL (ref 79–97)
Platelets: 276 10*3/uL (ref 150–450)
RBC: 5.02 x10E6/uL (ref 3.77–5.28)
RDW: 13.7 % (ref 11.7–15.4)
WBC: 7.4 10*3/uL (ref 3.4–10.8)

## 2019-07-02 LAB — COMPREHENSIVE METABOLIC PANEL
ALT: 25 IU/L (ref 0–32)
AST: 30 IU/L (ref 0–40)
Albumin/Globulin Ratio: 1.5 (ref 1.2–2.2)
Albumin: 4.3 g/dL (ref 3.8–4.8)
Alkaline Phosphatase: 52 IU/L (ref 39–117)
BUN/Creatinine Ratio: 9 (ref 9–23)
BUN: 8 mg/dL (ref 6–20)
Bilirubin Total: 0.7 mg/dL (ref 0.0–1.2)
CO2: 21 mmol/L (ref 20–29)
Calcium: 9.3 mg/dL (ref 8.7–10.2)
Chloride: 104 mmol/L (ref 96–106)
Creatinine, Ser: 0.91 mg/dL (ref 0.57–1.00)
GFR calc Af Amer: 92 mL/min/{1.73_m2} (ref >59)
GFR calc non Af Amer: 80 mL/min/{1.73_m2} (ref >59)
Globulin, Total: 2.8 g/dL (ref 1.5–4.5)
Glucose: 110 mg/dL — ABNORMAL HIGH (ref 65–99)
Potassium: 3.9 mmol/L (ref 3.5–5.2)
Sodium: 139 mmol/L (ref 134–144)
Total Protein: 7.1 g/dL (ref 6.0–8.5)

## 2019-07-02 LAB — LIPID PANEL
Chol/HDL Ratio: 1.9 ratio (ref 0.0–4.4)
Cholesterol, Total: 145 mg/dL (ref 100–199)
HDL: 76 mg/dL (ref >39)
LDL Chol Calc (NIH): 60 mg/dL (ref 0–99)
Triglycerides: 38 mg/dL (ref 0–149)
VLDL Cholesterol Cal: 9 mg/dL (ref 5–40)

## 2019-07-02 LAB — HIV ANTIBODY (ROUTINE TESTING W REFLEX): HIV Screen 4th Generation wRfx: NONREACTIVE

## 2019-07-02 LAB — RPR: RPR Ser Ql: NONREACTIVE

## 2019-07-02 LAB — TSH: TSH: 1.42 u[IU]/mL (ref 0.450–4.500)

## 2019-07-02 NOTE — Telephone Encounter (Signed)
Pt aware of results per JAG's note and voiced understanding. JSY

## 2019-07-02 NOTE — Telephone Encounter (Signed)
-----   Message from Adline Potter, NP sent at 07/02/2019  9:27 AM EST ----- Let pt know HIV and RPR both negative and Blood sugar slightly up cholesterol was good, and HGB was 12.3, thyroid was normal

## 2019-07-02 NOTE — Telephone Encounter (Signed)
Erroneous encounter

## 2019-07-02 NOTE — Telephone Encounter (Signed)
Pt aware of labs  

## 2019-07-03 LAB — CYTOLOGY - PAP
Adequacy: ABSENT
Chlamydia: NEGATIVE
Comment: NEGATIVE
Comment: NEGATIVE
Comment: NORMAL
Diagnosis: NEGATIVE
High risk HPV: NEGATIVE
Neisseria Gonorrhea: NEGATIVE

## 2019-12-18 ENCOUNTER — Telehealth: Payer: Self-pay | Admitting: Adult Health

## 2019-12-18 NOTE — Telephone Encounter (Signed)
Pt would like someone to review pap results from [facility]

## 2019-12-18 NOTE — Telephone Encounter (Signed)
Telephoned patient at home number and can not be completed as dialed.  

## 2019-12-18 NOTE — Telephone Encounter (Signed)
Patient returned call. Advised patient pap smear was normal. HPV was negative. Next pap due in 3 years. Patient voiced understanding.

## 2020-11-24 ENCOUNTER — Encounter: Payer: Self-pay | Admitting: Adult Health

## 2020-11-24 ENCOUNTER — Ambulatory Visit (INDEPENDENT_AMBULATORY_CARE_PROVIDER_SITE_OTHER): Payer: BC Managed Care – PPO | Admitting: Adult Health

## 2020-11-24 ENCOUNTER — Other Ambulatory Visit: Payer: Self-pay

## 2020-11-24 VITALS — BP 126/89 | HR 78 | Ht 66.0 in | Wt 201.5 lb

## 2020-11-24 DIAGNOSIS — Z1231 Encounter for screening mammogram for malignant neoplasm of breast: Secondary | ICD-10-CM | POA: Diagnosis not present

## 2020-11-24 DIAGNOSIS — Z131 Encounter for screening for diabetes mellitus: Secondary | ICD-10-CM

## 2020-11-24 DIAGNOSIS — Z01419 Encounter for gynecological examination (general) (routine) without abnormal findings: Secondary | ICD-10-CM | POA: Diagnosis not present

## 2020-11-24 DIAGNOSIS — Z1211 Encounter for screening for malignant neoplasm of colon: Secondary | ICD-10-CM

## 2020-11-24 LAB — HEMOCCULT GUIAC POC 1CARD (OFFICE): Fecal Occult Blood, POC: NEGATIVE

## 2020-11-24 NOTE — Progress Notes (Signed)
Patient ID: Carol Wong, female   DOB: 10-07-1980, 40 y.o.   MRN: [id number] History of Present Illness: Carol Wong is a 40 year old black female,single, [id number], in for a well woman gyn exam. Lab Results  Component Value Date   DIAGPAP  07/01/2019    - Negative for intraepithelial lesion or malignancy (NILM)   HPV DETECTED (A) 09/25/2017   HPVHIGH Negative 07/01/2019   PCP is [facility]   Current Medications, Allergies, Past Medical History, Past Surgical History, Family History and Social History were reviewed in [name] record.     Review of Systems:  Patient denies any headaches, hearing loss, fatigue, blurred vision, shortness of breath, chest pain, abdominal pain, problems with bowel movements, urination, or intercourse. (Not active)No joint pain or mood swings.    Physical Exam:BP 126/89 (BP Location: Left Arm, Patient Position: Sitting, Cuff Size: Large)   Pulse 78   Ht 5\' 6"  (1.676 m)   Wt 201 lb 8 oz (91.4 kg)   LMP 11/09/2020 (Approximate)   BMI 32.52 kg/m   General:  Well developed, well nourished, no acute distress Skin:  Warm and dry Neck:  Midline trachea, normal thyroid, good ROM, no lymphadenopathy Lungs; Clear to auscultation bilaterally Breast:  No dominant palpable mass, retraction, or nipple discharge Cardiovascular: Regular rate and rhythm Abdomen:  Soft, non tender, no hepatosplenomegaly Pelvic:  External genitalia is normal in appearance, no lesions.  The vagina is normal in appearance. Urethra has no lesions or masses. The cervix is bulbous.  Uterus is felt to be normal size, shape, and contour.  No adnexal masses or tenderness noted.Bladder is non tender, no masses felt. Rectal: Good sphincter tone, no polyps, or hemorrhoids felt.  Hemoccult negative. Extremities/musculoskeletal:  No swelling or varicosities noted, no clubbing or cyanosis Psych:  No mood changes, alert and cooperative,seems happy AA is 0 Fall risk is  low Depression screen [facility] 2/9 11/24/2020 07/01/2019 09/25/2017  Decreased Interest 0 0 0  Down, Depressed, Hopeless 0 0 1  PHQ - 2 Score 0 0 1  Altered sleeping 0 - -  Tired, decreased energy 0 - -  Change in appetite 0 - -  Feeling bad or failure about yourself  0 - -  Trouble concentrating 0 - -  Moving slowly or fidgety/restless 0 - -  Suicidal thoughts 0 - -  PHQ-9 Score 0 - -    GAD 7 : Generalized Anxiety Score 11/24/2020  Nervous, Anxious, on Edge 0  Control/stop worrying 0  Worry too much - different things 0  Trouble relaxing 0  Restless 0  Easily annoyed or irritable 0  Afraid - awful might happen 0  Total GAD 7 Score 0      [facility] - 11/24/20 0849       Pregnancy Intention Screening   Does the patient want to become pregnant in the next year? No    Does the patient's partner want to become pregnant in the next year? No    Would the patient like to discuss contraceptive options today? No      Contraception Wrap Up   Current Method Female Sterilization    End Method Female Sterilization    Contraception Counseling Provided No            Examination chaperoned by 11/26/20 LPN   Impression and Plan: 1. Encounter for well woman exam with routine gynecological exam Physical in 1 year Pap in 2024 Check CBC,CMP,TSH and lipids   2.  Encounter for screening fecal occult blood testing   3. Screening mammogram for breast cancer Screeniong mammogram appt 12/02/20 at 11:30 am at [facility]  4. Screening for diabetes mellitus Check A1c

## 2020-11-25 LAB — LIPID PANEL
Chol/HDL Ratio: 2.2 ratio (ref 0.0–4.4)
Cholesterol, Total: 152 mg/dL (ref 100–199)
HDL: 70 mg/dL (ref >39)
LDL Chol Calc (NIH): 71 mg/dL (ref 0–99)
Triglycerides: 54 mg/dL (ref 0–149)
VLDL Cholesterol Cal: 11 mg/dL (ref 5–40)

## 2020-11-25 LAB — COMPREHENSIVE METABOLIC PANEL
ALT: 20 IU/L (ref 0–32)
AST: 17 IU/L (ref 0–40)
Albumin/Globulin Ratio: 1.3 (ref 1.2–2.2)
Albumin: 4.1 g/dL (ref 3.8–4.8)
Alkaline Phosphatase: 59 IU/L (ref 44–121)
BUN/Creatinine Ratio: 12 (ref 9–23)
BUN: 10 mg/dL (ref 6–24)
Bilirubin Total: 0.3 mg/dL (ref 0.0–1.2)
CO2: 21 mmol/L (ref 20–29)
Calcium: 9.3 mg/dL (ref 8.7–10.2)
Chloride: 103 mmol/L (ref 96–106)
Creatinine, Ser: 0.83 mg/dL (ref 0.57–1.00)
Globulin, Total: 3.2 g/dL (ref 1.5–4.5)
Glucose: 117 mg/dL — ABNORMAL HIGH (ref 65–99)
Potassium: 4.6 mmol/L (ref 3.5–5.2)
Sodium: 139 mmol/L (ref 134–144)
Total Protein: 7.3 g/dL (ref 6.0–8.5)
eGFR: 91 mL/min/{1.73_m2} (ref >59)

## 2020-11-25 LAB — HEMOGLOBIN A1C
Est. average glucose Bld gHb Est-mCnc: 131 mg/dL
Hgb A1c MFr Bld: 6.2 % — ABNORMAL HIGH (ref 4.8–5.6)

## 2020-11-25 LAB — CBC
Hematocrit: 38.4 % (ref 34.0–46.6)
Hemoglobin: 12.3 g/dL (ref 11.1–15.9)
MCH: 25.9 pg — ABNORMAL LOW (ref 26.6–33.0)
MCHC: 32 g/dL (ref 31.5–35.7)
MCV: 81 fL (ref 79–97)
Platelets: 223 10*3/uL (ref 150–450)
RBC: 4.75 x10E6/uL (ref 3.77–5.28)
RDW: 13.2 % (ref 11.7–15.4)
WBC: 6 10*3/uL (ref 3.4–10.8)

## 2020-11-25 LAB — TSH: TSH: 1.9 u[IU]/mL (ref 0.450–4.500)

## 2020-11-26 ENCOUNTER — Telehealth: Payer: Self-pay | Admitting: Adult Health

## 2020-11-26 NOTE — Telephone Encounter (Signed)
Pt aware that labs good except A1c is 6.2 so try to decrease carbs and walk more and lose the 20 lbs you gained, will recheck A1c in 6 months, will place in recall

## 2020-12-02 ENCOUNTER — Other Ambulatory Visit: Payer: Self-pay

## 2020-12-02 ENCOUNTER — Ambulatory Visit (HOSPITAL_COMMUNITY)
Admission: RE | Admit: 2020-12-02 | Discharge: 2020-12-02 | Disposition: A | Discharge status: Home / Self Care | Payer: BC Managed Care – PPO | Source: Ambulatory Visit | Attending: Adult Health | Admitting: Adult Health

## 2020-12-02 DIAGNOSIS — Z1231 Encounter for screening mammogram for malignant neoplasm of breast: Secondary | ICD-10-CM | POA: Insufficient documentation

## 2020-12-03 ENCOUNTER — Other Ambulatory Visit (HOSPITAL_COMMUNITY): Payer: Self-pay | Admitting: Adult Health

## 2020-12-03 DIAGNOSIS — R928 Other abnormal and inconclusive findings on diagnostic imaging of breast: Secondary | ICD-10-CM

## 2020-12-16 ENCOUNTER — Ambulatory Visit (HOSPITAL_COMMUNITY)
Admission: RE | Admit: 2020-12-16 | Discharge: 2020-12-16 | Disposition: A | Discharge status: Home / Self Care | Payer: BC Managed Care – PPO | Source: Ambulatory Visit | Attending: Adult Health | Admitting: Adult Health

## 2020-12-16 ENCOUNTER — Other Ambulatory Visit: Payer: Self-pay

## 2020-12-16 DIAGNOSIS — R928 Other abnormal and inconclusive findings on diagnostic imaging of breast: Secondary | ICD-10-CM

## 2020-12-16 DIAGNOSIS — R922 Inconclusive mammogram: Secondary | ICD-10-CM | POA: Diagnosis not present

## 2021-08-12 ENCOUNTER — Other Ambulatory Visit: Payer: Self-pay

## 2021-08-12 ENCOUNTER — Encounter: Payer: Self-pay | Admitting: Adult Health

## 2021-08-12 ENCOUNTER — Ambulatory Visit (INDEPENDENT_AMBULATORY_CARE_PROVIDER_SITE_OTHER): Payer: BC Managed Care – PPO | Admitting: Adult Health

## 2021-08-12 VITALS — BP 123/83 | HR 83 | Ht 66.0 in | Wt 195.0 lb

## 2021-08-12 DIAGNOSIS — N926 Irregular menstruation, unspecified: Secondary | ICD-10-CM | POA: Diagnosis not present

## 2021-08-12 DIAGNOSIS — N946 Dysmenorrhea, unspecified: Secondary | ICD-10-CM

## 2021-08-12 DIAGNOSIS — R232 Flushing: Secondary | ICD-10-CM

## 2021-08-12 DIAGNOSIS — R7309 Other abnormal glucose: Secondary | ICD-10-CM

## 2021-08-12 MED ORDER — LO LOESTRIN FE 1 MG-10 MCG / 10 MCG PO TABS: 1.0000 | ORAL_TABLET | Freq: Every day | ORAL | 0 refills | Status: DC

## 2021-08-12 NOTE — Progress Notes (Signed)
?  Subjective:  ?  ? Patient ID: Carol Wong, female   DOB: 10/24/1980, 41 y.o.   MRN: [id number] ? ?HPI ?Carol Wong is a 41 year old black female.single, [id number], in complaining of period cramps, hot flashes and more hair on chin and neck. She has lost about 7 lbs, going to gym now. ? ?Lab Results  ?Component Value Date  ? DIAGPAP  07/01/2019  ?  - Negative for intraepithelial lesion or malignancy (NILM)  ? HPV DETECTED (A) 09/25/2017  ? HPVHIGH Negative 07/01/2019  ?  ?Review of Systems ?Has periods cramps ?Bleeds for 5 days ?Periods are monthly ?Has hot flashes, esp at work ?Has more chin hairs and wild hair on neck  ?Not currently sexually active. ?Reviewed past medical,surgical, social and family history. Reviewed medications and allergies.  ?   ?Objective:  ? Physical Exam ?BP 123/83 (BP Location: Right Arm, Patient Position: Sitting, Cuff Size: Normal)   Pulse 83   Ht 5\' 6"  (1.676 m)   Wt 195 lb (88.5 kg)   LMP 07/22/2021   BMI 31.47 kg/m?   ?  Skin warm and dry.Lungs: clear to ausculation bilaterally. Cardiovascular: regular rate and rhythm.  ? [facility] - 08/12/21 1405   ? ?  ? Pregnancy Intention Screening  ? Does the patient want to become pregnant in the next year? No   ? Does the patient's partner want to become pregnant in the next year? No   ? Would the patient like to discuss contraceptive options today? No   ?  ? Contraception Wrap Up  ? Current Method Female Sterilization   ? End Method Female Sterilization   ? Contraception Counseling Provided No   ? ?  ?  ? ?  ?  ?Assessment:  ?   ? 1. Dysmenorrhea ?Will try lo Loestrin to see if helps with pain ? ?2. Hot flashes ?Will try lo Loestrin  ? ?3. Elevated hemoglobin A1c ?Will check A1c today  ?Will talk when results back  ? ?4. Encounter for management of menstrual issue ?Given 3 packs lo Loestrin to start with next period to see if helps with period pain, hot flashes and facial hair  ?   ?Plan:  ?   ?Continue weight loss efforts ?Return in around  11/25/21 for physical and ROS  ?   ?

## 2021-08-13 LAB — HEMOGLOBIN A1C
Est. average glucose Bld gHb Est-mCnc: 128 mg/dL
Hgb A1c MFr Bld: 6.1 % — ABNORMAL HIGH (ref 4.8–5.6)

## 2021-11-15 ENCOUNTER — Ambulatory Visit (INDEPENDENT_AMBULATORY_CARE_PROVIDER_SITE_OTHER): Payer: BC Managed Care – PPO | Admitting: Women's Health

## 2021-11-15 ENCOUNTER — Other Ambulatory Visit (HOSPITAL_COMMUNITY)
Admission: RE | Admit: 2021-11-15 | Discharge: 2021-11-15 | Disposition: A | Discharge status: Home / Self Care | Payer: BC Managed Care – PPO | Source: Ambulatory Visit | Attending: Women's Health | Admitting: Women's Health

## 2021-11-15 ENCOUNTER — Encounter: Payer: Self-pay | Admitting: Women's Health

## 2021-11-15 VITALS — BP 125/86 | HR 89 | Ht 65.0 in | Wt 196.2 lb

## 2021-11-15 DIAGNOSIS — N926 Irregular menstruation, unspecified: Secondary | ICD-10-CM | POA: Diagnosis not present

## 2021-11-15 DIAGNOSIS — N760 Acute vaginitis: Secondary | ICD-10-CM | POA: Diagnosis not present

## 2021-11-15 NOTE — Progress Notes (Signed)
   GYN VISIT Patient name: Carol Wong MRN [id number]  Date of birth: [birth year] Chief Complaint:   multiple periods with in month  History of Present Illness:   Carol Wong is a 41 y.o. [id number] African-American female being seen today for report of period a little bit later in May, then came back again about 2wks later but was shorter than normal. No itching/irritation. New sex partner since Feb, but has used condom every time. Did not start LoLo rx'd in March d/t potential side effects.     Patient's last menstrual period was 10/20/2021. The current method of family planning is condoms.  Last pap 07/01/19. Results were: NILM w/ HRHPV negative     11/24/2020    8:49 AM 07/01/2019    9:10 AM 09/25/2017    9:12 AM 08/12/2016    9:51 AM  Depression screen PHQ 2/9  Decreased Interest 0 0 0 0  Down, Depressed, Hopeless 0 0 1 0  PHQ - 2 Score 0 0 1 0  Altered sleeping 0     Tired, decreased energy 0     Change in appetite 0     Feeling bad or failure about yourself  0     Trouble concentrating 0     Moving slowly or fidgety/restless 0     Suicidal thoughts 0     PHQ-9 Score 0           11/24/2020    8:49 AM  GAD 7 : Generalized Anxiety Score  Nervous, Anxious, on Edge 0  Control/stop worrying 0  Worry too much - different things 0  Trouble relaxing 0  Restless 0  Easily annoyed or irritable 0  Afraid - awful might happen 0  Total GAD 7 Score 0     Review of Systems:   Pertinent items are noted in HPI Denies fever/chills, dizziness, headaches, visual disturbances, fatigue, shortness of breath, chest pain, abdominal pain, vomiting, abnormal vaginal discharge/itching/odor/irritation, problems with periods, bowel movements, urination, or intercourse unless otherwise stated above.  Pertinent History Reviewed:  Reviewed past medical,surgical, social, obstetrical and family history.  Reviewed problem list, medications and allergies. Physical Assessment:   Vitals:    11/15/21 0919  BP: 125/86  Pulse: 89  Weight: 196 lb 3.2 oz (89 kg)  Height: 5\' 5"  (1.651 m)  Body mass index is 32.65 kg/m.       Physical Examination:   General appearance: alert, well appearing, and in no distress  Mental status: alert, oriented to person, place, and time  Skin: warm & dry   Cardiovascular: normal heart rate noted  Respiratory: normal respiratory effort, no distress  Abdomen: soft, non-tender   Pelvic: VULVA: normal appearing vulva with no masses, tenderness or lesions, VAGINA: normal appearing vagina with normal color, no lesions, thin yellowish d/c CERVIX: normal appearing cervix without discharge or lesions  Extremities: no edema   Chaperone:    No results found for this or any previous visit (from the past 24 hour(s)).  Assessment & Plan:  1) Irregular periods> CV swab  Meds: No orders of the defined types were placed in this encounter.   No orders of the defined types were placed in this encounter.   Return in about 1 year (around 11/16/2022) for Pap & physical.  01/16/2023 CNM, [facility] 11/15/2021 9:57 AM

## 2021-11-16 LAB — CERVICOVAGINAL ANCILLARY ONLY
Bacterial Vaginitis (gardnerella): POSITIVE — AB
Candida Glabrata: NEGATIVE
Candida Vaginitis: NEGATIVE
Chlamydia: NEGATIVE
Comment: NEGATIVE
Comment: NEGATIVE
Comment: NEGATIVE
Comment: NEGATIVE
Comment: NEGATIVE
Comment: NORMAL
Neisseria Gonorrhea: NEGATIVE
Trichomonas: NEGATIVE

## 2021-11-16 MED ORDER — METRONIDAZOLE 500 MG PO TABS: 500.0000 mg | ORAL_TABLET | Freq: Two times a day (BID) | ORAL | 0 refills | Status: DC

## 2021-11-16 NOTE — Addendum Note (Signed)
Addended by: Wells Guiles R on: 11/16/2021 01:21 PM   Modules accepted: Orders

## 2021-11-19 ENCOUNTER — Encounter: Payer: Self-pay | Admitting: Women's Health

## 2021-11-26 ENCOUNTER — Ambulatory Visit: Payer: BC Managed Care – PPO | Admitting: Adult Health

## 2021-12-07 ENCOUNTER — Other Ambulatory Visit (HOSPITAL_COMMUNITY): Payer: Self-pay | Admitting: Adult Health

## 2021-12-07 DIAGNOSIS — Z1231 Encounter for screening mammogram for malignant neoplasm of breast: Secondary | ICD-10-CM

## 2021-12-17 ENCOUNTER — Ambulatory Visit (HOSPITAL_COMMUNITY)
Admission: RE | Admit: 2021-12-17 | Discharge: 2021-12-17 | Disposition: A | Discharge status: Home / Self Care | Payer: BC Managed Care – PPO | Source: Ambulatory Visit | Attending: Adult Health | Admitting: Adult Health

## 2021-12-17 ENCOUNTER — Ambulatory Visit (HOSPITAL_COMMUNITY): Payer: BC Managed Care – PPO

## 2021-12-17 DIAGNOSIS — Z1231 Encounter for screening mammogram for malignant neoplasm of breast: Secondary | ICD-10-CM | POA: Diagnosis not present

## 2021-12-29 ENCOUNTER — Other Ambulatory Visit (HOSPITAL_COMMUNITY)
Admission: RE | Admit: 2021-12-29 | Discharge: 2021-12-29 | Disposition: A | Discharge status: Home / Self Care | Payer: BC Managed Care – PPO | Source: Ambulatory Visit | Attending: Adult Health | Admitting: Adult Health

## 2021-12-29 ENCOUNTER — Encounter: Payer: Self-pay | Admitting: Adult Health

## 2021-12-29 ENCOUNTER — Ambulatory Visit (INDEPENDENT_AMBULATORY_CARE_PROVIDER_SITE_OTHER): Payer: BC Managed Care – PPO | Admitting: Adult Health

## 2021-12-29 VITALS — BP 131/84 | HR 84 | Ht 65.5 in | Wt 199.0 lb

## 2021-12-29 DIAGNOSIS — Z1211 Encounter for screening for malignant neoplasm of colon: Secondary | ICD-10-CM

## 2021-12-29 DIAGNOSIS — R232 Flushing: Secondary | ICD-10-CM | POA: Diagnosis not present

## 2021-12-29 DIAGNOSIS — Z0000 Encounter for general adult medical examination without abnormal findings: Secondary | ICD-10-CM | POA: Diagnosis not present

## 2021-12-29 DIAGNOSIS — Z01419 Encounter for gynecological examination (general) (routine) without abnormal findings: Secondary | ICD-10-CM | POA: Diagnosis not present

## 2021-12-29 LAB — HEMOCCULT GUIAC POC 1CARD (OFFICE): Fecal Occult Blood, POC: NEGATIVE

## 2021-12-29 NOTE — Progress Notes (Signed)
Patient ID: TAMMERA ENGERT, female   DOB: 02-Nov-1980, 41 y.o.   MRN: [id number] History of Present Illness: Mikalah is a 41 year old black female, single, [id number], in for well woman gyn exam and pap.  Her brother is in hospital and not doing well.   Current Medications, Allergies, Past Medical History, Past Surgical History, Family History and Social History were reviewed in [name] record.     Review of Systems: Patient denies any headaches, hearing loss, fatigue, blurred vision, shortness of breath, chest pain, abdominal pain, problems with bowel movements, urination, or intercourse(not active). No joint pain or mood swings.  +hot flashes esp at work  +facial hair    Physical Exam:BP 131/84 (BP Location: Left Arm, Patient Position: Sitting, Cuff Size: Normal)   Pulse 84   Ht 5' 5.5" (1.664 m)   Wt 199 lb (90.3 kg)   LMP 12/05/2021   BMI 32.61 kg/m   General:  Well developed, well nourished, no acute distress Skin:  Warm and dry Neck:  Midline trachea, normal thyroid, good ROM, no lymphadenopathy Lungs; Clear to auscultation bilaterally Breast:  No dominant palpable mass, retraction, or nipple discharge Cardiovascular: Regular rate and rhythm Abdomen:  Soft, non tender, no hepatosplenomegaly Pelvic:  External genitalia is normal in appearance, no lesions.  The vagina is normal in appearance. Urethra has no lesions or masses. The cervix is bulbous. Pap with HR HPV genotyping performed.  Uterus is felt to be normal size, shape, and contour.  No adnexal masses or tenderness noted.Bladder is non tender, no masses felt. Rectal: Good sphincter tone, no polyps, or hemorrhoids felt.  Hemoccult negative. Extremities/musculoskeletal:  No swelling or varicosities noted, no clubbing or cyanosis Psych:  No mood changes, alert and cooperative,seems happy AA is 1 Fall risk is low    12/29/2021    3:44 PM 11/24/2020    8:49 AM 07/01/2019    9:10 AM  Depression screen  PHQ 2/9  Decreased Interest 0 0 0  Down, Depressed, Hopeless 0 0 0  PHQ - 2 Score 0 0 0  Altered sleeping 0 0   Tired, decreased energy 0 0   Change in appetite 0 0   Feeling bad or failure about yourself  0 0   Trouble concentrating 0 0   Moving slowly or fidgety/restless 0 0   Suicidal thoughts 0 0   PHQ-9 Score 0 0        12/29/2021    3:44 PM 11/24/2020    8:49 AM  GAD 7 : Generalized Anxiety Score  Nervous, Anxious, on Edge 0 0  Control/stop worrying 0 0  Worry too much - different things 0 0  Trouble relaxing 0 0  Restless 0 0  Easily annoyed or irritable 0 0  Afraid - awful might happen 0 0  Total GAD 7 Score 0 0      Upstream - 12/29/21 1553       Pregnancy Intention Screening   Does the patient want to become pregnant in the next year? No    Does the patient's partner want to become pregnant in the next year? No    Would the patient like to discuss contraceptive options today? No      Contraception Wrap Up   Current Method Female Sterilization    End Method Female Sterilization            Examination chaperoned by Malachy Mood LPN   Impression and Plan: 1. Routine general medical  examination at a health care facility Pap sent   2. Encounter for gynecological examination with Papanicolaou smear of cervix Pap sent Physical in 1 year  Pap in 3 if normal Had normal mammogram 12/17/21 Will get fasting labs in September per her request, she will call,Check CBC,CMP,TSH and lipids and A1c   3. Encounter for screening fecal occult blood testing Hemoccult was negative   4. Hot flashes Will watch for now

## 2021-12-31 LAB — CYTOLOGY - PAP
Comment: NEGATIVE
Diagnosis: NEGATIVE
High risk HPV: NEGATIVE

## 2022-01-14 ENCOUNTER — Other Ambulatory Visit: Payer: Self-pay | Admitting: Obstetrics & Gynecology

## 2022-01-14 ENCOUNTER — Telehealth: Payer: Self-pay

## 2022-01-14 MED ORDER — NUVESSA 1.3 % VA GEL: 1.0000 | Freq: Once | VAGINAL | 1 refills | Status: AC

## 2022-01-14 NOTE — Telephone Encounter (Signed)
Pt has bv and wants to know if we can send in a script for her

## 2022-01-14 NOTE — Telephone Encounter (Addendum)
Pt has a slight discharge. She thinks its BV. Can you send in med? Please advise. Thanks! JSY

## 2022-03-16 DIAGNOSIS — Z77120 Contact with and (suspected) exposure to mold (toxic): Secondary | ICD-10-CM | POA: Diagnosis not present

## 2022-03-16 DIAGNOSIS — J309 Allergic rhinitis, unspecified: Secondary | ICD-10-CM | POA: Diagnosis not present

## 2022-05-04 ENCOUNTER — Telehealth: Payer: Self-pay | Admitting: Adult Health

## 2022-05-04 ENCOUNTER — Telehealth: Payer: Self-pay | Admitting: *Deleted

## 2022-05-04 MED ORDER — METRONIDAZOLE 500 MG PO TABS: 500.0000 mg | ORAL_TABLET | Freq: Two times a day (BID) | ORAL | 0 refills | Status: DC

## 2022-05-04 NOTE — Telephone Encounter (Signed)
Rx sent in for flagyl

## 2022-05-04 NOTE — Telephone Encounter (Signed)
Having BV symptoms. Does she need to make an appointment or can you call in the same medicine she had a couple of months ago?

## 2022-05-04 NOTE — Telephone Encounter (Signed)
Pt requests pill vs gel for BV. Thanks! JSY

## 2022-05-30 DIAGNOSIS — R9431 Abnormal electrocardiogram [ECG] [EKG]: Secondary | ICD-10-CM | POA: Diagnosis not present

## 2022-05-30 DIAGNOSIS — Z1152 Encounter for screening for COVID-19: Secondary | ICD-10-CM | POA: Diagnosis not present

## 2022-05-30 DIAGNOSIS — M79601 Pain in right arm: Secondary | ICD-10-CM | POA: Diagnosis not present

## 2022-05-30 DIAGNOSIS — Z20822 Contact with and (suspected) exposure to covid-19: Secondary | ICD-10-CM | POA: Diagnosis not present

## 2022-05-30 DIAGNOSIS — R0789 Other chest pain: Secondary | ICD-10-CM | POA: Diagnosis not present

## 2022-05-30 DIAGNOSIS — R0602 Shortness of breath: Secondary | ICD-10-CM | POA: Diagnosis not present

## 2022-05-30 DIAGNOSIS — K2100 Gastro-esophageal reflux disease with esophagitis, without bleeding: Secondary | ICD-10-CM | POA: Diagnosis not present

## 2022-05-30 DIAGNOSIS — R55 Syncope and collapse: Secondary | ICD-10-CM | POA: Diagnosis not present

## 2022-05-30 DIAGNOSIS — R079 Chest pain, unspecified: Secondary | ICD-10-CM | POA: Diagnosis not present

## 2022-10-03 ENCOUNTER — Telehealth: Payer: Self-pay | Admitting: Adult Health

## 2022-10-03 NOTE — Telephone Encounter (Signed)
Pt has a questions about hormonal changes. Please advise.

## 2022-10-03 NOTE — Telephone Encounter (Signed)
Mailbox full @ 11:05 am. No other number listed. JSY

## 2022-10-05 ENCOUNTER — Other Ambulatory Visit: Payer: BC Managed Care – PPO

## 2022-10-05 ENCOUNTER — Other Ambulatory Visit: Payer: Self-pay | Admitting: Adult Health

## 2022-10-05 MED ORDER — METRONIDAZOLE 500 MG PO TABS: 500.0000 mg | ORAL_TABLET | Freq: Two times a day (BID) | ORAL | 0 refills | Status: DC

## 2022-10-05 NOTE — Progress Notes (Signed)
Rx flagyl for BV

## 2022-11-04 ENCOUNTER — Other Ambulatory Visit (HOSPITAL_COMMUNITY): Payer: Self-pay | Admitting: Adult Health

## 2022-11-04 DIAGNOSIS — Z1231 Encounter for screening mammogram for malignant neoplasm of breast: Secondary | ICD-10-CM

## 2022-11-08 ENCOUNTER — Other Ambulatory Visit (HOSPITAL_COMMUNITY)
Admission: RE | Admit: 2022-11-08 | Discharge: 2022-11-08 | Disposition: A | Discharge status: Home / Self Care | Payer: Medicaid Other | Source: Ambulatory Visit | Attending: Obstetrics & Gynecology | Admitting: Obstetrics & Gynecology

## 2022-11-08 ENCOUNTER — Ambulatory Visit (INDEPENDENT_AMBULATORY_CARE_PROVIDER_SITE_OTHER): Payer: Medicaid Other | Admitting: *Deleted

## 2022-11-08 VITALS — Ht 65.0 in | Wt 210.0 lb

## 2022-11-08 DIAGNOSIS — N898 Other specified noninflammatory disorders of vagina: Secondary | ICD-10-CM | POA: Insufficient documentation

## 2022-11-08 NOTE — Progress Notes (Signed)
   NURSE VISIT- VAGINITIS/STD  SUBJECTIVE:  Carol Wong is a 42 y.o. [id number] GYN patientfemale here for a vaginal swab for vaginitis screening, STD screen.  She reports the following symptoms: vulvar itching for several days. Denies abnormal vaginal bleeding, significant pelvic pain, fever, or UTI symptoms.  OBJECTIVE:  Ht 5\' 5"  (1.651 m)   Wt 210 lb (95.3 kg)   BMI 34.95 kg/m   Appears well, in no apparent distress  ASSESSMENT: Vaginal swab for vaginitis screening & STD screening  PLAN: Self-collected vaginal probe for Gonorrhea, Chlamydia, Trichomonas, Bacterial Vaginosis, Yeast sent to lab Treatment: to be determined once results are received Follow-up as needed if symptoms persist/worsen, or new symptoms develop  Malachy Mood  11/08/2022 4:08 PM

## 2022-11-10 ENCOUNTER — Other Ambulatory Visit: Payer: Self-pay | Admitting: Adult Health

## 2022-11-10 LAB — CERVICOVAGINAL ANCILLARY ONLY
Bacterial Vaginitis (gardnerella): NEGATIVE
Candida Glabrata: NEGATIVE
Candida Vaginitis: POSITIVE — AB
Chlamydia: NEGATIVE
Comment: NEGATIVE
Comment: NEGATIVE
Comment: NEGATIVE
Comment: NEGATIVE
Comment: NEGATIVE
Comment: NORMAL
Neisseria Gonorrhea: NEGATIVE
Trichomonas: NEGATIVE

## 2022-11-10 MED ORDER — FLUCONAZOLE 150 MG PO TABS: ORAL_TABLET | ORAL | 1 refills | Status: DC

## 2022-11-10 NOTE — Progress Notes (Signed)
+  yeast on vaginal swab rx diflucan  

## 2022-12-21 ENCOUNTER — Ambulatory Visit (HOSPITAL_COMMUNITY): Payer: Medicaid Other

## 2023-01-03 ENCOUNTER — Encounter: Payer: Self-pay | Admitting: Adult Health

## 2023-01-03 ENCOUNTER — Ambulatory Visit (INDEPENDENT_AMBULATORY_CARE_PROVIDER_SITE_OTHER): Payer: No Typology Code available for payment source | Admitting: Adult Health

## 2023-01-03 VITALS — BP 135/85 | HR 91 | Ht 65.0 in | Wt 215.0 lb

## 2023-01-03 DIAGNOSIS — R7309 Other abnormal glucose: Secondary | ICD-10-CM

## 2023-01-03 DIAGNOSIS — Z01419 Encounter for gynecological examination (general) (routine) without abnormal findings: Secondary | ICD-10-CM

## 2023-01-03 DIAGNOSIS — R635 Abnormal weight gain: Secondary | ICD-10-CM

## 2023-01-03 DIAGNOSIS — Z3202 Encounter for pregnancy test, result negative: Secondary | ICD-10-CM

## 2023-01-03 DIAGNOSIS — R232 Flushing: Secondary | ICD-10-CM

## 2023-01-03 DIAGNOSIS — Z13220 Encounter for screening for lipoid disorders: Secondary | ICD-10-CM

## 2023-01-03 DIAGNOSIS — Z1211 Encounter for screening for malignant neoplasm of colon: Secondary | ICD-10-CM

## 2023-01-03 DIAGNOSIS — N926 Irregular menstruation, unspecified: Secondary | ICD-10-CM | POA: Diagnosis not present

## 2023-01-03 DIAGNOSIS — Z1329 Encounter for screening for other suspected endocrine disorder: Secondary | ICD-10-CM | POA: Insufficient documentation

## 2023-01-03 DIAGNOSIS — N951 Menopausal and female climacteric states: Secondary | ICD-10-CM

## 2023-01-03 LAB — HEMOCCULT GUIAC POC 1CARD (OFFICE): Fecal Occult Blood, POC: NEGATIVE

## 2023-01-03 LAB — POCT URINE PREGNANCY: Preg Test, Ur: NEGATIVE

## 2023-01-03 NOTE — Progress Notes (Signed)
Patient ID: Carol Wong, female   DOB: [birth year], 42 y.o.   MRN: [id number] History of Present Illness: Carol Wong is a 42 year old black female,single, [phone] in for a well woman gyn exam. She has missed a period this month, has hot flashes and has gained weight and is moody.     Component Value Date/Time   DIAGPAP  12/29/2021 1556    - Negative for intraepithelial lesion or malignancy (NILM)   DIAGPAP  07/01/2019 0911    - Negative for intraepithelial lesion or malignancy (NILM)   DIAGPAP  09/25/2017 0000    NEGATIVE FOR INTRAEPITHELIAL LESIONS OR MALIGNANCY.   HPVHIGH Negative 12/29/2021 1556   HPVHIGH Negative 07/01/2019 0911   ADEQPAP  12/29/2021 1556    Satisfactory for evaluation; transformation zone component PRESENT.   ADEQPAP  07/01/2019 0911    Satisfactory for evaluation; transformation zone component ABSENT.   ADEQPAP  09/25/2017 0000    Satisfactory for evaluation  endocervical/transformation zone component PRESENT.     Current Medications, Allergies, Past Medical History, Past Surgical History, Family History and Social History were reviewed in [name] record.     Review of Systems: Patient denies any headaches, hearing loss, fatigue, blurred vision, shortness of breath, chest pain, abdominal pain, problems with bowel movements, urination, or intercourse(not active). No joint pain or mood swings. Nipple tender at times. See HPI for positives   Physical Exam:BP 135/85 (BP Location: Left Arm, Patient Position: Sitting, Cuff Size: Normal)   Pulse 91   Ht 5\' 5"  (1.651 m)   Wt 215 lb (97.5 kg)   LMP 12/01/2022   BMI 35.78 kg/m  UPT is negative  General:  Well developed, well nourished, no acute distress Skin:  Warm and dry Neck:  Midline trachea, normal thyroid, good ROM, no lymphadenopathy Lungs; Clear to auscultation bilaterally Breast:  No dominant palpable mass, retraction, or nipple discharge Cardiovascular: Regular rate and  rhythm Abdomen:  Soft, non tender, no hepatosplenomegaly Pelvic:  External genitalia is normal in appearance, no lesions.  The vagina is normal in appearance. Urethra has no lesions or masses. The cervix is bulbous.  Uterus is felt to be normal size, shape, and contour.  No adnexal masses or tenderness noted.Bladder is non tender, no masses felt. Rectal: Good sphincter tone, no polyps, or hemorrhoids felt.  Hemoccult negative. Extremities/musculoskeletal:  No swelling or varicosities noted, no clubbing or cyanosis Psych:  No mood changes, alert and cooperative,seems happy AA is 0 Fall risk is low    01/03/2023    3:30 PM 12/29/2021    3:44 PM 11/24/2020    8:49 AM  Depression screen PHQ 2/9  Decreased Interest 0 0 0  Down, Depressed, Hopeless 0 0 0  PHQ - 2 Score 0 0 0  Altered sleeping 0 0 0  Tired, decreased energy 0 0 0  Change in appetite 0 0 0  Feeling bad or failure about yourself  0 0 0  Trouble concentrating 0 0 0  Moving slowly or fidgety/restless 0 0 0  Suicidal thoughts 0 0 0  PHQ-9 Score 0 0 0       01/03/2023    3:30 PM 12/29/2021    3:44 PM 11/24/2020    8:49 AM  GAD 7 : Generalized Anxiety Score  Nervous, Anxious, on Edge 0 0 0  Control/stop worrying 0 0 0  Worry too much - different things 0 0 0  Trouble relaxing 0 0 0  Restless 0 0 0  Easily annoyed or irritable 0 0 0  Afraid - awful might happen 0 0 0  Total GAD 7 Score 0 0 0      Upstream - 01/03/23 1541       Pregnancy Intention Screening   Does the patient want to become pregnant in the next year? No    Does the patient's partner want to become pregnant in the next year? No    Would the patient like to discuss contraceptive options today? No      Contraception Wrap Up   Current Method Female Sterilization    End Method Female Sterilization    Contraception Counseling Provided No            Examination chaperoned by Malachy Mood LPN  Impression and Plan: 1. Pregnancy examination or test,  negative result - POCT urine pregnancy  2. Encounter for well woman exam with routine gynecological exam Physical in 1 year Pap in 2026 Will check labs  Mammogram 01/16/23. - CBC - Comprehensive metabolic panel - Lipid panel  3. Encounter for screening fecal occult blood testing Hemoccult was negative  - POCT occult blood stool  4. Missed period  5. Hot flashes  6. Perimenopause Discussed that missed period, hot flashes wight fgain and being moody part of perimenopause phase Review hand out on perimenopause   7. Elevated hemoglobin A1c - Hemoglobin A1c  8. Screening cholesterol level - Lipid panel  9. Screening for thyroid disorder - TSH + free T4  10. Gain of weight +weight gain  Will check thyroid  - TSH + free T4

## 2023-01-04 LAB — COMPREHENSIVE METABOLIC PANEL
ALT: 23 IU/L (ref 0–32)
AST: 29 IU/L (ref 0–40)
Albumin: 4.5 g/dL (ref 3.9–4.9)
Alkaline Phosphatase: 59 IU/L (ref 44–121)
BUN/Creatinine Ratio: 16 (ref 9–23)
BUN: 13 mg/dL (ref 6–24)
Bilirubin Total: 0.4 mg/dL (ref 0.0–1.2)
CO2: 23 mmol/L (ref 20–29)
Calcium: 9.5 mg/dL (ref 8.7–10.2)
Chloride: 102 mmol/L (ref 96–106)
Creatinine, Ser: 0.83 mg/dL (ref 0.57–1.00)
Globulin, Total: 2.7 g/dL (ref 1.5–4.5)
Glucose: 96 mg/dL (ref 70–99)
Potassium: 4.3 mmol/L (ref 3.5–5.2)
Sodium: 137 mmol/L (ref 134–144)
Total Protein: 7.2 g/dL (ref 6.0–8.5)
eGFR: 90 mL/min/{1.73_m2} (ref >59)

## 2023-01-04 LAB — LIPID PANEL
Chol/HDL Ratio: 2 ratio (ref 0.0–4.4)
Cholesterol, Total: 155 mg/dL (ref 100–199)
HDL: 79 mg/dL (ref >39)
LDL Chol Calc (NIH): 64 mg/dL (ref 0–99)
Triglycerides: 60 mg/dL (ref 0–149)
VLDL Cholesterol Cal: 12 mg/dL (ref 5–40)

## 2023-01-04 LAB — CBC
Hematocrit: 39.2 % (ref 34.0–46.6)
Hemoglobin: 12.1 g/dL (ref 11.1–15.9)
MCH: 24.4 pg — ABNORMAL LOW (ref 26.6–33.0)
MCHC: 30.9 g/dL — ABNORMAL LOW (ref 31.5–35.7)
MCV: 79 fL (ref 79–97)
Platelets: 241 10*3/uL (ref 150–450)
RBC: 4.95 x10E6/uL (ref 3.77–5.28)
RDW: 13.4 % (ref 11.7–15.4)
WBC: 7.1 10*3/uL (ref 3.4–10.8)

## 2023-01-04 LAB — TSH+FREE T4
Free T4: 1.11 ng/dL (ref 0.82–1.77)
TSH: 1.85 u[IU]/mL (ref 0.450–4.500)

## 2023-01-04 LAB — HEMOGLOBIN A1C
Est. average glucose Bld gHb Est-mCnc: 137 mg/dL
Hgb A1c MFr Bld: 6.4 % — ABNORMAL HIGH (ref 4.8–5.6)

## 2023-01-05 ENCOUNTER — Telehealth: Payer: Self-pay | Admitting: Adult Health

## 2023-01-05 NOTE — Telephone Encounter (Signed)
Pt states she would like a call back about results

## 2023-01-05 NOTE — Telephone Encounter (Signed)
Pt saw results on her MyChart and was concerned that MCH and [facility] was low on CBC. I spoke with JAG. Pt advised it's nothing to worry about and nothing that she needs to do. Pt voiced understanding. JSY

## 2023-01-16 ENCOUNTER — Ambulatory Visit (HOSPITAL_COMMUNITY)
Admission: RE | Admit: 2023-01-16 | Discharge: 2023-01-16 | Disposition: A | Discharge status: Home / Self Care | Payer: No Typology Code available for payment source | Source: Ambulatory Visit | Attending: Adult Health | Admitting: Adult Health

## 2023-01-16 DIAGNOSIS — Z1231 Encounter for screening mammogram for malignant neoplasm of breast: Secondary | ICD-10-CM | POA: Diagnosis not present

## 2023-01-18 ENCOUNTER — Other Ambulatory Visit (HOSPITAL_COMMUNITY): Payer: Self-pay | Admitting: Adult Health

## 2023-01-18 ENCOUNTER — Telehealth: Payer: Self-pay

## 2023-01-18 DIAGNOSIS — R928 Other abnormal and inconclusive findings on diagnostic imaging of breast: Secondary | ICD-10-CM

## 2023-01-18 NOTE — Telephone Encounter (Signed)
Patient called and wants someone to call her about her Mammogram results.

## 2023-01-18 NOTE — Telephone Encounter (Signed)
Mammogram showed possible asymmetry in right breast and will need more imaging, pt called them and has 02/07/23

## 2023-01-31 ENCOUNTER — Encounter (HOSPITAL_COMMUNITY): Payer: Self-pay

## 2023-01-31 ENCOUNTER — Ambulatory Visit (HOSPITAL_COMMUNITY)
Admission: RE | Admit: 2023-01-31 | Discharge: 2023-01-31 | Disposition: A | Discharge status: Home / Self Care | Payer: No Typology Code available for payment source | Source: Ambulatory Visit | Attending: Adult Health | Admitting: Adult Health

## 2023-01-31 DIAGNOSIS — R928 Other abnormal and inconclusive findings on diagnostic imaging of breast: Secondary | ICD-10-CM | POA: Insufficient documentation

## 2023-02-07 ENCOUNTER — Encounter (HOSPITAL_COMMUNITY): Payer: No Typology Code available for payment source

## 2023-02-07 ENCOUNTER — Ambulatory Visit (HOSPITAL_COMMUNITY): Payer: No Typology Code available for payment source

## 2023-02-08 ENCOUNTER — Telehealth: Payer: Self-pay | Admitting: Adult Health

## 2023-02-08 ENCOUNTER — Other Ambulatory Visit: Payer: Self-pay | Admitting: *Deleted

## 2023-02-08 MED ORDER — FLUCONAZOLE 150 MG PO TABS: ORAL_TABLET | ORAL | 1 refills | Status: DC

## 2023-02-08 NOTE — Telephone Encounter (Signed)
Pt states she would like a prescription for a yeast infection. Please advise

## 2023-02-08 NOTE — Addendum Note (Signed)
Addended by: Cyril Mourning A on: 02/08/2023 10:50 AM   Modules accepted: Orders

## 2023-02-08 NOTE — Telephone Encounter (Signed)
Will rx diflucan  

## 2023-02-08 NOTE — Telephone Encounter (Addendum)
Pt has vaginal itching and discharge. Pt is requesting something for yeast. Thanks! JSY

## 2023-02-21 ENCOUNTER — Other Ambulatory Visit (INDEPENDENT_AMBULATORY_CARE_PROVIDER_SITE_OTHER): Payer: No Typology Code available for payment source

## 2023-02-21 ENCOUNTER — Other Ambulatory Visit (HOSPITAL_COMMUNITY)
Admission: RE | Admit: 2023-02-21 | Discharge: 2023-02-21 | Disposition: A | Discharge status: Home / Self Care | Payer: No Typology Code available for payment source | Source: Ambulatory Visit | Attending: Obstetrics & Gynecology | Admitting: Obstetrics & Gynecology

## 2023-02-21 DIAGNOSIS — N898 Other specified noninflammatory disorders of vagina: Secondary | ICD-10-CM | POA: Insufficient documentation

## 2023-02-21 NOTE — Progress Notes (Signed)
   NURSE VISIT- VAGINITIS/STD/POC  SUBJECTIVE:  Carol Wong is a 42 y.o. [id number] GYN patient female here for a vaginal swab for vaginitis screening.  She reports the following symptoms: discharge described as pale yellow and odor for 2 days. Denies abnormal vaginal bleeding, significant pelvic pain, fever, or UTI symptoms.  OBJECTIVE:  LMP 01/15/2023   Appears well, in no apparent distress  ASSESSMENT: Vaginal swab for vaginitis screening  PLAN: Self-collected vaginal probe for Bacterial Vaginosis, Yeast sent to lab. Patient declines STD screening. Treatment: to be determined once results are received Follow-up as needed if symptoms persist/worsen, or new symptoms develop  Carol Wong  02/21/2023 2:27 PM

## 2023-02-23 ENCOUNTER — Other Ambulatory Visit: Payer: Self-pay | Admitting: Adult Health

## 2023-02-23 LAB — CERVICOVAGINAL ANCILLARY ONLY
Bacterial Vaginitis (gardnerella): POSITIVE — AB
Candida Glabrata: NEGATIVE
Candida Vaginitis: NEGATIVE
Comment: NEGATIVE
Comment: NEGATIVE
Comment: NEGATIVE

## 2023-02-23 MED ORDER — METRONIDAZOLE 500 MG PO TABS: 500.0000 mg | ORAL_TABLET | Freq: Two times a day (BID) | ORAL | 0 refills | Status: DC

## 2023-02-23 NOTE — Progress Notes (Signed)
+  BV on vaginal swab, will rx flagyl, no sex or alcohol while taking  

## 2023-02-26 IMAGING — MG MM DIGITAL SCREENING BILAT W/ TOMO AND CAD
8 series · 8 of 24 positions shown · non-contrast
Comparison: None.

CLINICAL DATA: Screening.

EXAM:
DIGITAL SCREENING BILATERAL MAMMOGRAM WITH TOMOSYNTHESIS AND CAD
TECHNIQUE: Bilateral screening digital craniocaudal and mediolateral oblique
mammograms were obtained. Bilateral screening digital breast
tomosynthesis was performed. The images were evaluated with
computer-aided detection.

[R CC synth-2D]
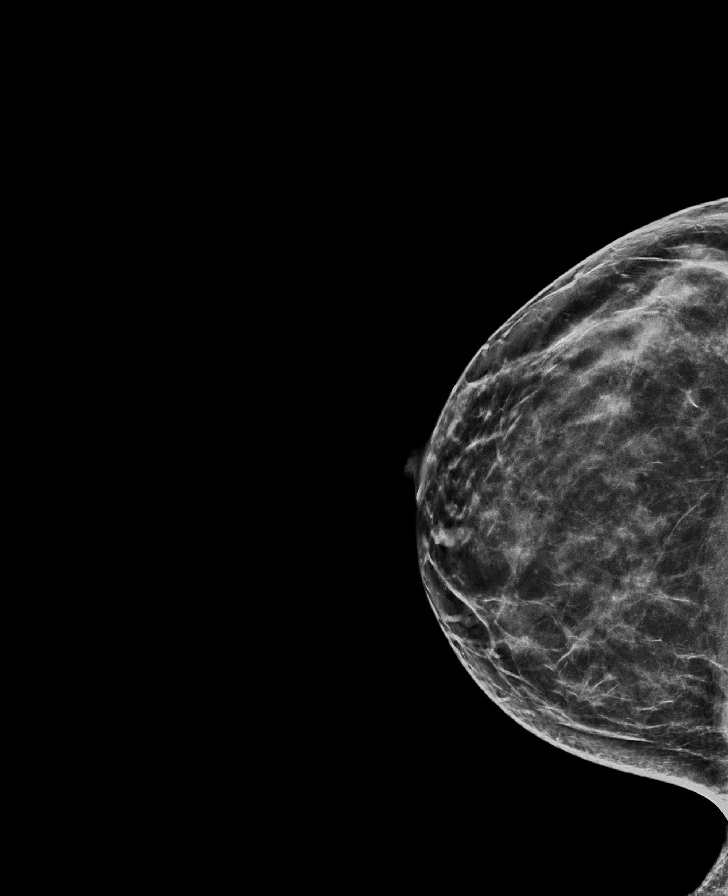

[R MLO synth-2D]
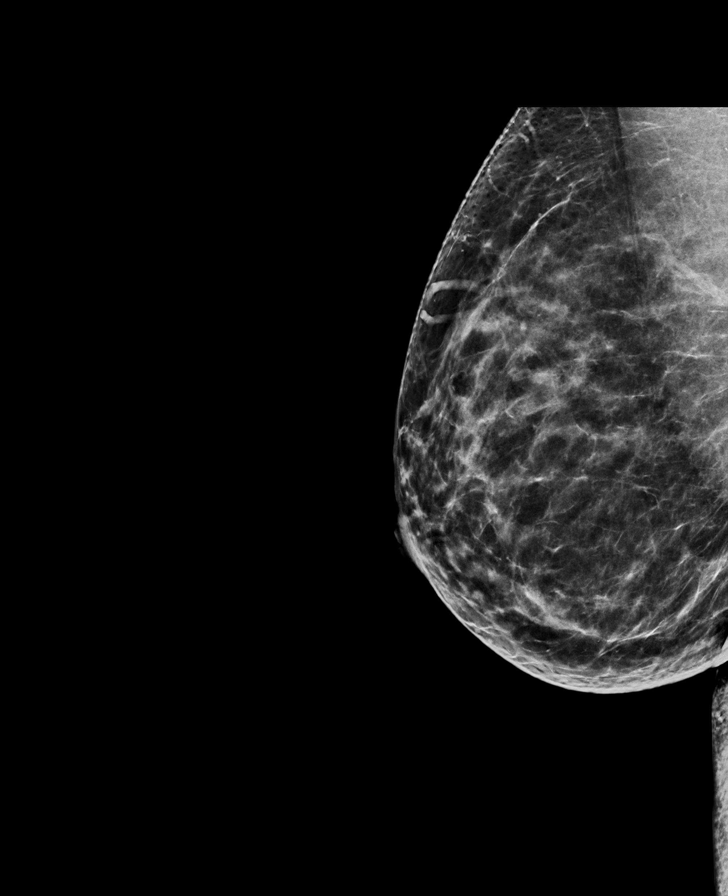

[L CC synth-2D]
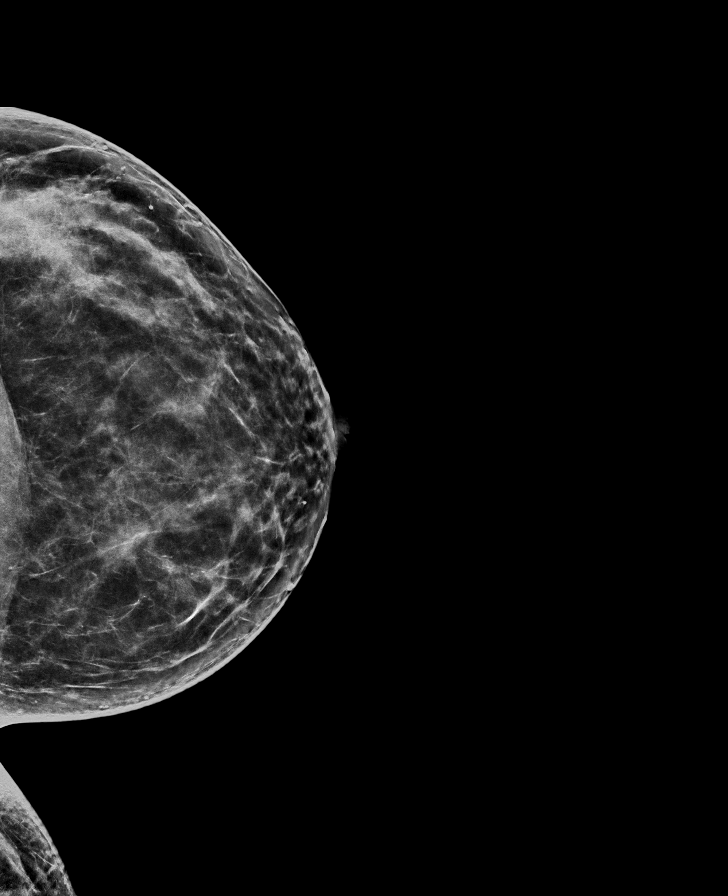

[L MLO synth-2D]
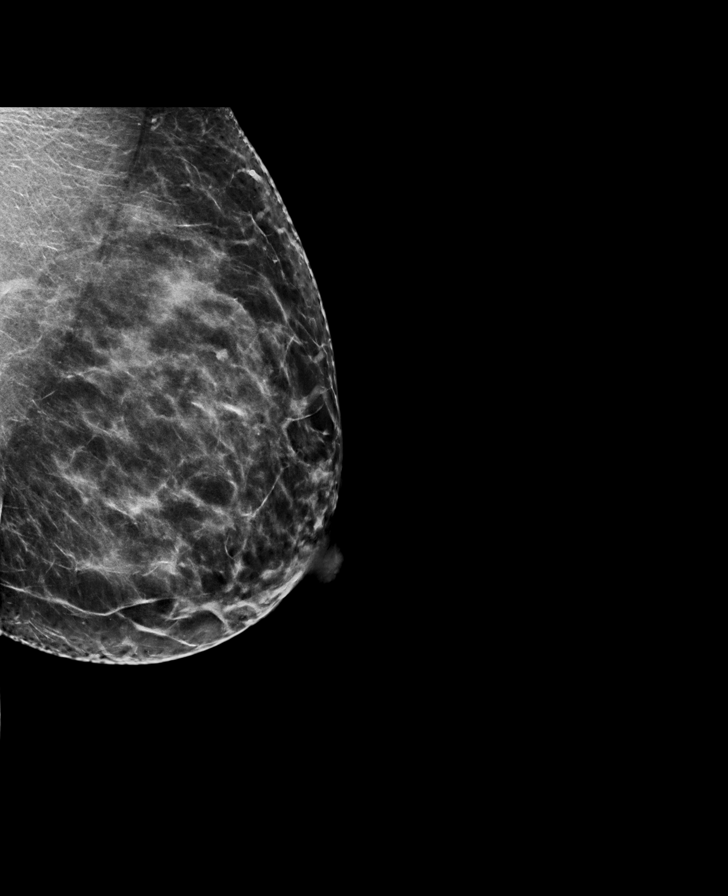

[R CC tomo · tomo slice 37/74.0]
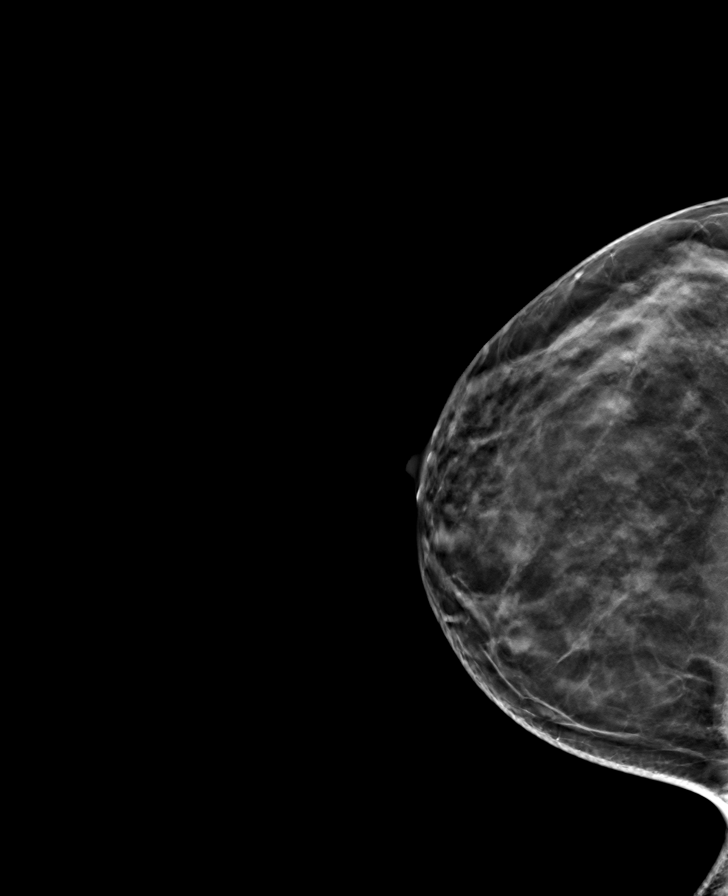

[L MLO tomo · tomo slice 44/87.0]
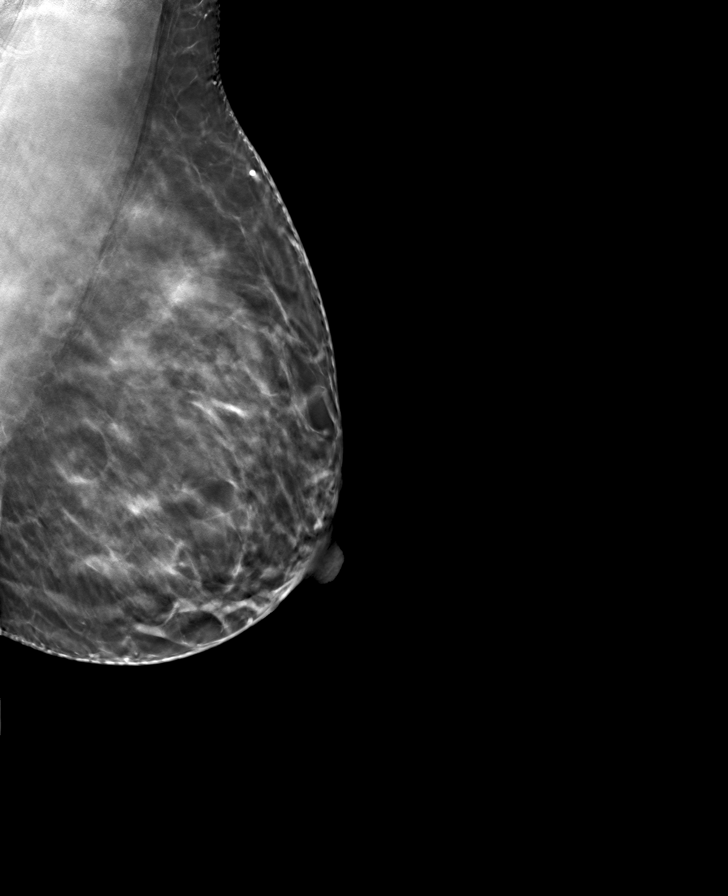

[L CC tomo · tomo slice 38/75.0]
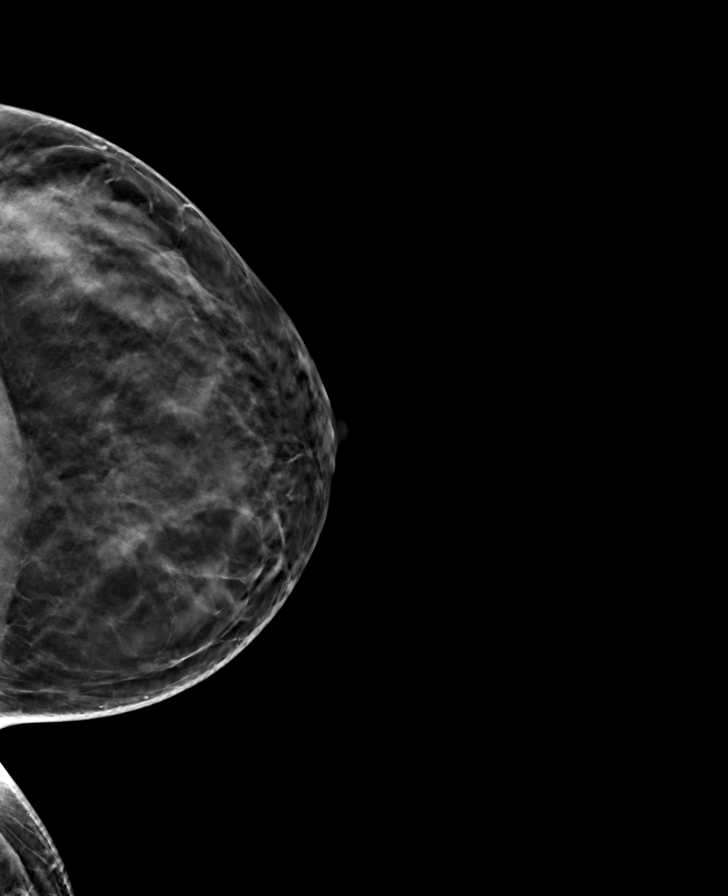

[R MLO tomo · tomo slice 37/74.0]
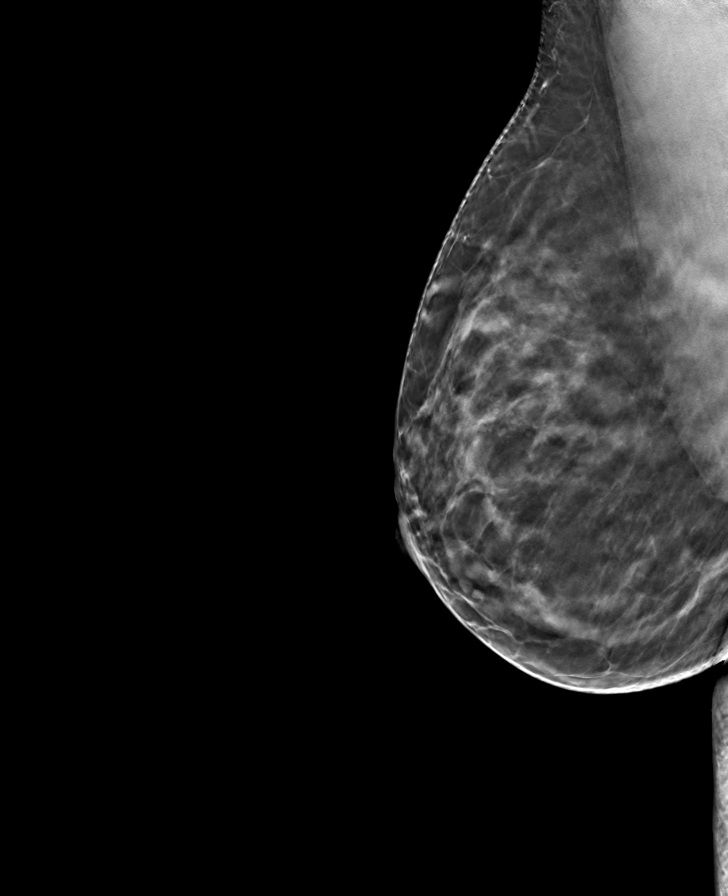

[8 of 24 positions shown; findings below may reference images not displayed]

ACR Breast Density Category c: The breast tissue is heterogeneously
dense, which may obscure small masses.
FINDINGS: In the right breast, possible distortion warrants further
evaluation. In the left breast, no findings suspicious for
malignancy.
IMPRESSION: Further evaluation is suggested for possible distortion in the right
breast.

RECOMMENDATION:
Diagnostic mammogram and possibly ultrasound of the right breast.
(Code:[id number]-[id number])

The patient will be contacted regarding the findings, and additional
imaging will be scheduled.

BI-RADS CATEGORY  0: Incomplete. Need additional imaging evaluation
and/or prior mammograms for comparison.

## 2023-04-13 ENCOUNTER — Other Ambulatory Visit: Payer: Self-pay | Admitting: Adult Health

## 2023-04-13 MED ORDER — METRONIDAZOLE 500 MG PO TABS: 500.0000 mg | ORAL_TABLET | Freq: Two times a day (BID) | ORAL | 1 refills | Status: DC

## 2023-05-05 ENCOUNTER — Other Ambulatory Visit: Payer: Self-pay | Admitting: Adult Health

## 2023-05-05 MED ORDER — FLUCONAZOLE 150 MG PO TABS: ORAL_TABLET | ORAL | 1 refills | Status: DC

## 2023-05-05 MED ORDER — METRONIDAZOLE 500 MG PO TABS: 500.0000 mg | ORAL_TABLET | Freq: Two times a day (BID) | ORAL | 1 refills | Status: DC

## 2023-05-05 NOTE — Progress Notes (Signed)
Rx diflucan and refilled flagyl

## 2023-08-02 ENCOUNTER — Other Ambulatory Visit: Payer: Self-pay

## 2023-08-02 ENCOUNTER — Emergency Department (HOSPITAL_BASED_OUTPATIENT_CLINIC_OR_DEPARTMENT_OTHER)
Admission: EM | Admit: 2023-08-02 | Discharge: 2023-08-02 | Disposition: A | Discharge status: Home / Self Care | Payer: No Typology Code available for payment source | Attending: Emergency Medicine | Admitting: Emergency Medicine

## 2023-08-02 ENCOUNTER — Encounter (HOSPITAL_BASED_OUTPATIENT_CLINIC_OR_DEPARTMENT_OTHER): Payer: Self-pay

## 2023-08-02 ENCOUNTER — Emergency Department (HOSPITAL_BASED_OUTPATIENT_CLINIC_OR_DEPARTMENT_OTHER): Payer: No Typology Code available for payment source

## 2023-08-02 DIAGNOSIS — Z20822 Contact with and (suspected) exposure to covid-19: Secondary | ICD-10-CM | POA: Diagnosis not present

## 2023-08-02 DIAGNOSIS — J069 Acute upper respiratory infection, unspecified: Secondary | ICD-10-CM | POA: Insufficient documentation

## 2023-08-02 DIAGNOSIS — R059 Cough, unspecified: Secondary | ICD-10-CM | POA: Diagnosis present

## 2023-08-02 LAB — RESP PANEL BY RT-PCR (RSV, FLU A&B, COVID)  RVPGX2
Influenza A by PCR: NEGATIVE
Influenza B by PCR: NEGATIVE
Resp Syncytial Virus by PCR: NEGATIVE
SARS Coronavirus 2 by RT PCR: NEGATIVE

## 2023-08-02 MED ORDER — BENZONATATE 100 MG PO CAPS: 100.0000 mg | ORAL_CAPSULE | Freq: Three times a day (TID) | ORAL | 0 refills | Status: DC

## 2023-08-02 NOTE — ED Provider Notes (Signed)
 [facility] Provider Note   CSN: [id number] Arrival date & time: 08/02/23  0419     History  Chief Complaint  Patient presents with   Cough    Cough only s/s    Carol Wong is a 43 y.o. female.  Patient is a 43 year old female presenting with complaints of cough.  This has been present for the past several days.  Cough is intermittently productive of a green sputum.  She denies any chest pain or difficulty breathing.  She denies ill contacts.  She has been taking over-the-counter medications with little relief.  The history is provided by the patient.       Home Medications Prior to Admission medications   Medication Sig Start Date End Date Taking? Authorizing Provider  fluconazole (DIFLUCAN) 150 MG tablet Take 1 now and 1 in 3 days 05/05/23   Adline Potter, NP  metroNIDAZOLE (FLAGYL) 500 MG tablet Take 1 tablet (500 mg total) by mouth 2 (two) times daily. 05/05/23   Adline Potter, NP      Allergies    Compazine    Review of Systems   Review of Systems  All other systems reviewed and are negative.   Physical Exam Updated Vital Signs BP 120/82   Pulse 100   Temp (!) 97.5 F (36.4 C) (Oral)   Ht 5\' 5"  (1.651 m)   Wt 97.1 kg   SpO2 100%   BMI 35.61 kg/m  Physical Exam Vitals and nursing note reviewed.  Constitutional:      General: She is not in acute distress.    Appearance: She is well-developed. She is not diaphoretic.  HENT:     Head: Normocephalic and atraumatic.  Cardiovascular:     Rate and Rhythm: Normal rate and regular rhythm.     Heart sounds: No murmur heard.    No friction rub. No gallop.  Pulmonary:     Effort: Pulmonary effort is normal. No respiratory distress.     Breath sounds: Normal breath sounds. No wheezing.  Abdominal:     General: Bowel sounds are normal. There is no distension.     Palpations: Abdomen is soft.     Tenderness: There is no abdominal tenderness.   Musculoskeletal:        General: Normal range of motion.     Cervical back: Normal range of motion and neck supple.  Skin:    General: Skin is warm and dry.  Neurological:     General: No focal deficit present.     Mental Status: She is alert and oriented to person, place, and time.     ED Results / Procedures / Treatments   Labs (all labs ordered are listed, but only abnormal results are displayed) Labs Reviewed  RESP PANEL BY RT-PCR (RSV, FLU A&B, COVID)  [id number]    EKG None  Radiology No results found.  Procedures Procedures    Medications Ordered in ED Medications - No data to display  ED Course/ Medical Decision Making/ A&P  Patient is a 43 year old female presenting with complaints of cough and URI symptoms as described in the HPI.  She arrives here with stable vital signs and is afebrile.  Physical examination unremarkable.  Nasal swab for COVID/flu/RSV all negative.  Chest x-ray negative.  Symptoms most likely viral in nature.  I will prescribe Tessalon and advised continued use of over-the-counter medications with as needed return.  Final Clinical Impression(s) / ED Diagnoses Final diagnoses:  None    Rx / DC Orders ED Discharge Orders     None         Geoffery Lyons, MD 08/02/23 [phone]

## 2023-08-02 NOTE — ED Triage Notes (Signed)
Cc cough

## 2023-08-02 NOTE — Discharge Instructions (Signed)
 Begin taking Tessalon as prescribed as needed for cough.  Continue over-the-counter medications as needed for symptom relief.  Follow-up with primary doctor if not improving in the next week, and return to the ER if symptoms significantly worsen or change.

## 2023-08-25 ENCOUNTER — Telehealth

## 2023-08-25 ENCOUNTER — Ambulatory Visit: Payer: Self-pay

## 2023-08-25 NOTE — Telephone Encounter (Signed)
 Pt calling back in regarding previous triage. Pt continues to verbalize some anxiety related to her ongoing symptoms. This RN provided pt additional education with reassurance and reinforcement of previous RN's instructions. Pt agrees to continue with home care over the weekend and call for new/worsening symptoms.    Copied from CRM [id number]. Topic: Clinical - Pink Word Triage >> Aug 25, 2023  8:31 AM Almira Coaster wrote: Reason for Triage: Patient has not established care with a primary care as of yet she will see Dr.Michael on June 10th for a new patient appointment. She went to the hospital on 08/02/2023 and tested negative for the flu and covid. She is still experiencing a lingering cough and just feels off and would like to be triaged.

## 2023-08-25 NOTE — Telephone Encounter (Signed)
 Spoke with the patient and informed her our office is unable to give any advice since she has not been seen here before.

## 2023-08-25 NOTE — Telephone Encounter (Signed)
  Chief Complaint: cough Symptoms: cough Frequency: since 2/19 Pertinent Negatives: Patient denies chest pain, fever, sob. Disposition: [] ED /[] Urgent Care (no appt availability in office) / [] Appointment(In office/virtual)/ []  [facility] [place] [facility]/ [x] Home Care/ [] Refused Recommended Disposition /[] [facility]/ []  Follow-up with PCP Additional Notes: Patient states that she went to the ED back in Feb and was negative for COVID and FLU.  Stated her other symptoms have gotten better but has a linger cough. Patient states the she feel like she has to cough so and clear her throat. When she does cough it is non productive. Patient was a little anxious and worried as she stated she had a sister pass away from COVID, I offered patient a virtual urgent care visit just to ease her anxious but she decided to just take the home care advise and will call back if symptoms worsen.   Copied From CRM [id number]. Reason for Triage: Patient has not established care with a primary care as of yet she will see Dr.Michael on June 10th for a new patient appointment. She went to the hospital on 08/02/2023 and tested negative for the flu and covid. She is still experiencing a lingering cough and just feels off and would like to be triaged.   Reason for Disposition  Cough with cold symptoms (e.g., runny nose, postnasal drip, throat clearing)  Answer Assessment - Initial Assessment Questions 1. ONSET: "When did the cough begin?"      Weeks ago 2. SEVERITY: "How bad is the cough today?"      mild 3. SPUTUM: "Describe the color of your sputum" (none, dry cough; clear, white, yellow, green)     dry 4. HEMOPTYSIS: "Are you coughing up any blood?" If so ask: "How much?" (flecks, streaks, tablespoons, etc.)     no 5. DIFFICULTY BREATHING: "Are you having difficulty breathing?" If Yes, ask: "How bad is it?" (e.g., mild, moderate, severe)    - MILD: No SOB at rest, mild SOB with walking, speaks normally in  sentences, can lie down, no retractions, pulse < 100.    - MODERATE: SOB at rest, SOB with minimal exertion and prefers to sit, cannot lie down flat, speaks in phrases, mild retractions, audible wheezing, pulse 100-120.    - SEVERE: Very SOB at rest, speaks in single words, struggling to breathe, sitting hunched forward, retractions, pulse > 120      none 6. FEVER: "Do you have a fever?" If Yes, ask: "What is your temperature, how was it measured, and when did it start?"     no 7. CARDIAC HISTORY: "Do you have any history of heart disease?" (e.g., heart attack, congestive heart failure)      no 8. LUNG HISTORY: "Do you have any history of lung disease?"  (e.g., pulmonary embolus, asthma, emphysema)     no 9. PE RISK FACTORS: "Do you have a history of blood clots?" (or: recent major surgery, recent prolonged travel, bedridden)     no 10. OTHER SYMPTOMS: "Do you have any other symptoms?" (e.g., runny nose, wheezing, chest pain)       Some occasional sneezing, runny nose, eyes itching.  Protocols used: Cough - Acute Non-Productive-A-AH

## 2023-10-23 ENCOUNTER — Other Ambulatory Visit: Payer: Self-pay | Admitting: Adult Health

## 2023-10-23 MED ORDER — METRONIDAZOLE 500 MG PO TABS: 500.0000 mg | ORAL_TABLET | Freq: Two times a day (BID) | ORAL | 1 refills | Status: DC

## 2023-10-23 NOTE — Progress Notes (Signed)
 Rx flagyl

## 2023-11-13 ENCOUNTER — Ambulatory Visit: Admitting: Internal Medicine

## 2023-11-20 ENCOUNTER — Ambulatory Visit: Admitting: Family Medicine

## 2023-11-21 ENCOUNTER — Ambulatory Visit: Admitting: Family Medicine

## 2023-12-14 ENCOUNTER — Other Ambulatory Visit: Payer: Self-pay | Admitting: Adult Health

## 2024-01-12 ENCOUNTER — Other Ambulatory Visit (HOSPITAL_COMMUNITY): Payer: Self-pay | Admitting: Adult Health

## 2024-01-12 DIAGNOSIS — Z1231 Encounter for screening mammogram for malignant neoplasm of breast: Secondary | ICD-10-CM

## 2024-02-05 ENCOUNTER — Encounter (HOSPITAL_COMMUNITY): Payer: Self-pay

## 2024-02-05 ENCOUNTER — Ambulatory Visit (HOSPITAL_COMMUNITY)
Admission: RE | Admit: 2024-02-05 | Discharge: 2024-02-05 | Disposition: A | Discharge status: Home / Self Care | Source: Ambulatory Visit | Attending: Adult Health | Admitting: Adult Health

## 2024-02-05 DIAGNOSIS — Z1231 Encounter for screening mammogram for malignant neoplasm of breast: Secondary | ICD-10-CM | POA: Diagnosis present

## 2024-02-08 ENCOUNTER — Ambulatory Visit: Payer: Self-pay | Admitting: Adult Health

## 2024-03-12 ENCOUNTER — Ambulatory Visit: Admitting: Adult Health

## 2024-03-13 ENCOUNTER — Ambulatory Visit: Admitting: Adult Health

## 2024-03-13 ENCOUNTER — Other Ambulatory Visit (HOSPITAL_COMMUNITY)
Admission: RE | Admit: 2024-03-13 | Discharge: 2024-03-13 | Disposition: A | Discharge status: Home / Self Care | Source: Ambulatory Visit | Attending: Adult Health | Admitting: Adult Health

## 2024-03-13 ENCOUNTER — Encounter: Payer: Self-pay | Admitting: Adult Health

## 2024-03-13 VITALS — BP 131/85 | HR 78 | Ht 65.0 in | Wt 206.5 lb

## 2024-03-13 DIAGNOSIS — N951 Menopausal and female climacteric states: Secondary | ICD-10-CM | POA: Diagnosis not present

## 2024-03-13 DIAGNOSIS — R7309 Other abnormal glucose: Secondary | ICD-10-CM

## 2024-03-13 DIAGNOSIS — Z1331 Encounter for screening for depression: Secondary | ICD-10-CM | POA: Diagnosis not present

## 2024-03-13 DIAGNOSIS — R232 Flushing: Secondary | ICD-10-CM

## 2024-03-13 DIAGNOSIS — Z1151 Encounter for screening for human papillomavirus (HPV): Secondary | ICD-10-CM | POA: Diagnosis not present

## 2024-03-13 DIAGNOSIS — Z1211 Encounter for screening for malignant neoplasm of colon: Secondary | ICD-10-CM | POA: Diagnosis not present

## 2024-03-13 DIAGNOSIS — Z01419 Encounter for gynecological examination (general) (routine) without abnormal findings: Secondary | ICD-10-CM | POA: Insufficient documentation

## 2024-03-13 DIAGNOSIS — N926 Irregular menstruation, unspecified: Secondary | ICD-10-CM

## 2024-03-13 DIAGNOSIS — Z13220 Encounter for screening for lipoid disorders: Secondary | ICD-10-CM

## 2024-03-13 LAB — HEMOCCULT GUIAC POC 1CARD (OFFICE): Fecal Occult Blood, POC: NEGATIVE

## 2024-03-13 NOTE — Progress Notes (Signed)
 Patient ID: Carol Wong, female   DOB: 05/28/1981, 43 y.o.   MRN: [id number] History of Present Illness: Carol Wong is a 43 year old black female, single, [id number] in for a well woman gyn exam and requests a pap. She is having skipping periods and having hot flashes.   Current Medications, Allergies, Past Medical History, Past Surgical History, Family History and Social History were reviewed in [name] record.     Review of Systems: Patient denies any headaches, hearing loss, fatigue, blurred vision, shortness of breath, chest pain, abdominal pain, problems with bowel movements, urination, or intercourse.(Not active). No joint pain or mood swings.  See HPI for positives   Physical Exam:BP 131/85 (BP Location: Left Arm, Patient Position: Sitting, Cuff Size: Normal)   Pulse 78   Ht 5' 5 (1.651 m)   Wt 206 lb 8 oz (93.7 kg)   LMP 02/18/2024 (Exact Date)   BMI 34.36 kg/m   she has lost 9 lbs since last year. General:  Well developed, well nourished, no acute distress Skin:  Warm and dry Neck:  Midline trachea, normal thyroid , good ROM, no lymphadenopathy Lungs; Clear to auscultation bilaterally Breast:  No dominant palpable mass, retraction, or nipple discharge Cardiovascular: Regular rate and rhythm Abdomen:  Soft, non tender, no hepatosplenomegaly Pelvic:  External genitalia is normal in appearance, no lesions.  The vagina is normal in appearance. Urethra has no lesions or masses. The cervix is smooth, pap with HR HPV genotyping performed.  Uterus is felt to be normal size, shape, and contour.  No adnexal masses or tenderness noted.Bladder is non tender, no masses felt. Rectal: Good sphincter tone, no polyps, or hemorrhoids felt.  Hemoccult negative. Extremities/musculoskeletal:  No swelling or varicosities noted, no clubbing or cyanosis Psych:  No mood changes, alert and cooperative,seems happy AA is 0 Fall risk is low    03/13/2024    3:34 PM 01/03/2023     3:30 PM 12/29/2021    3:44 PM  Depression screen PHQ 2/9  Decreased Interest 0 0 0  Down, Depressed, Hopeless 0 0 0  PHQ - 2 Score 0 0 0  Altered sleeping 0 0 0  Tired, decreased energy 0 0 0  Change in appetite 0 0 0  Feeling bad or failure about yourself  0 0 0  Trouble concentrating 0 0 0  Moving slowly or fidgety/restless 0 0 0  Suicidal thoughts 0 0 0  PHQ-9 Score 0 0 0       03/13/2024    3:35 PM 01/03/2023    3:30 PM 12/29/2021    3:44 PM 11/24/2020    8:49 AM  GAD 7 : Generalized Anxiety Score  Nervous, Anxious, on Edge 0 0 0 0  Control/stop worrying 0 0 0 0  Worry too much - different things 0 0 0 0  Trouble relaxing 0 0 0 0  Restless 0 0 0 0  Easily annoyed or irritable 0 0 0 0  Afraid - awful might happen 0 0 0 0  Total GAD 7 Score 0 0 0 0    Upstream - 03/13/24 1543       Pregnancy Intention Screening   Does the patient want to become pregnant in the next year? No    Does the patient's partner want to become pregnant in the next year? No    Would the patient like to discuss contraceptive options today? No      Contraception Wrap Up   Current Method  Female Sterilization;Abstinence    End Method Female Sterilization;Abstinence    Contraception Counseling Provided No           Examination chaperoned by Clarita Salt LPN   Impression and plan: 1. Encounter for gynecological examination with Papanicolaou smear of cervix (Primary) Pap sent Pap in 3 years if normal Physical in 1 year Check labs fsting  - Cytology - PAP( [facility]) - CBC - Comprehensive metabolic panel with GFR - Lipid panel Mammogram was negative 02/05/24  2. Encounter for screening fecal occult blood testing Hemoccult was negative  - POCT occult blood stool  3. Perimenopause Review handout on perimenopause   4. Hot flashes  5. Irregular periods Skipping periods   6. Elevated hemoglobin A1c - Hemoglobin A1c  7. Screening cholesterol level - Lipid panel

## 2024-03-15 LAB — CYTOLOGY - PAP
Adequacy: ABSENT
Comment: NEGATIVE
Diagnosis: NEGATIVE
High risk HPV: NEGATIVE

## 2024-03-17 ENCOUNTER — Ambulatory Visit: Payer: Self-pay | Admitting: Adult Health

## 2024-03-28 ENCOUNTER — Other Ambulatory Visit: Payer: Self-pay | Admitting: Adult Health

## 2024-03-28 MED ORDER — METRONIDAZOLE 500 MG PO TABS: 500.0000 mg | ORAL_TABLET | Freq: Two times a day (BID) | ORAL | 0 refills | Status: DC

## 2024-03-28 NOTE — Progress Notes (Signed)
 Rx flagyl

## 2024-04-08 ENCOUNTER — Other Ambulatory Visit: Payer: Self-pay | Admitting: Adult Health

## 2024-04-08 MED ORDER — FLUCONAZOLE 150 MG PO TABS: ORAL_TABLET | ORAL | 1 refills | Status: AC

## 2024-04-08 NOTE — Progress Notes (Signed)
 Rx sent for diflucan

## 2024-04-18 ENCOUNTER — Telehealth: Payer: Self-pay

## 2024-04-18 NOTE — Telephone Encounter (Signed)
 Patient had Bv took diflucan  on 04/11/24 and 04/14/24, itching started on Monday 04/15/24 and has been itching since. Itching is occurring on her legs, her bottom and the outside of her vagina. Patient states I could not sleep last night because of the itching. Rn advised to take benadryl and see if it stops her itching. If itching still occurs patient is to message office for further evaluation. Patient verbalized plan of care.

## 2024-04-30 ENCOUNTER — Ambulatory Visit: Admitting: *Deleted

## 2024-04-30 ENCOUNTER — Other Ambulatory Visit (HOSPITAL_COMMUNITY)
Admission: RE | Admit: 2024-04-30 | Discharge: 2024-04-30 | Disposition: A | Discharge status: Home / Self Care | Source: Ambulatory Visit | Attending: Obstetrics & Gynecology | Admitting: Obstetrics & Gynecology

## 2024-04-30 DIAGNOSIS — N898 Other specified noninflammatory disorders of vagina: Secondary | ICD-10-CM | POA: Insufficient documentation

## 2024-04-30 DIAGNOSIS — Z113 Encounter for screening for infections with a predominantly sexual mode of transmission: Secondary | ICD-10-CM | POA: Insufficient documentation

## 2024-04-30 NOTE — Progress Notes (Signed)
   NURSE VISIT- VAGINITIS/STD/POC  SUBJECTIVE:  Carol Wong is a 43 y.o. [id number] GYN patientfemale here for a vaginal swab for vaginitis screening/std screen.  She reports the following symptoms: discharge described as white for several days. Denies abnormal vaginal bleeding, significant pelvic pain, fever, or UTI symptoms.  OBJECTIVE:  There were no vitals taken for this visit.  Appears well, in no apparent distress  ASSESSMENT: Vaginal swab for vaginitis screening/std screen  PLAN: Self-collected vaginal probe for Bacterial Vaginosis, Yeast, GC and trich sent to lab Treatment: to be determined once results are received Follow-up as needed if symptoms persist/worsen, or new symptoms develop  Alan LITTIE Fischer  04/30/2024 2:14 PM

## 2024-05-02 ENCOUNTER — Ambulatory Visit: Payer: Self-pay | Admitting: Adult Health

## 2024-05-02 LAB — CERVICOVAGINAL ANCILLARY ONLY
Bacterial Vaginitis (gardnerella): NEGATIVE
Candida Glabrata: NEGATIVE
Candida Vaginitis: NEGATIVE
Chlamydia: NEGATIVE
Comment: NEGATIVE
Comment: NEGATIVE
Comment: NEGATIVE
Comment: NEGATIVE
Comment: NEGATIVE
Comment: NORMAL
Neisseria Gonorrhea: NEGATIVE
Trichomonas: NEGATIVE

## 2024-05-29 ENCOUNTER — Telehealth: Payer: Self-pay | Admitting: *Deleted

## 2024-05-29 NOTE — Telephone Encounter (Signed)
 Called patient per request.  States she thinks she has BV again and wants to know why she continues to get it.  Wants to know about trying probiotic or boric acid. Informed patient either of those would be fine to use.

## 2024-07-11 ENCOUNTER — Other Ambulatory Visit: Payer: Self-pay | Admitting: Adult Health

## 2024-07-11 MED ORDER — METRONIDAZOLE 500 MG PO TABS: 500.0000 mg | ORAL_TABLET | Freq: Two times a day (BID) | ORAL | 0 refills | Status: AC

## 2024-07-11 NOTE — Progress Notes (Signed)
 Rx flagyl
